# Patient Record
Sex: Female | Born: 1940 | Race: White | Hispanic: No | Marital: Single | Smoking: Former smoker
Health system: Southern US, Community
[De-identification: ages and names within clinical notes are randomized; demographics above are authoritative.]

## PROBLEM LIST (undated history)

## (undated) DIAGNOSIS — E7800 Pure hypercholesterolemia, unspecified: Secondary | ICD-10-CM

## (undated) DIAGNOSIS — E119 Type 2 diabetes mellitus without complications: Secondary | ICD-10-CM

## (undated) DIAGNOSIS — I10 Essential (primary) hypertension: Secondary | ICD-10-CM

## (undated) HISTORY — PX: FRACTURE SURGERY: SHX138

---
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
## 1976-04-10 HISTORY — PX: TUBAL LIGATION: SHX77

## 1976-04-10 HISTORY — PX: DILATION AND CURETTAGE OF UTERUS: SHX78

## 2006-04-10 HISTORY — PX: CATARACT EXTRACTION W/ INTRAOCULAR LENS IMPLANT: SHX1309

## 2010-04-10 HISTORY — PX: ANKLE FRACTURE SURGERY: SHX122

## 2011-01-22 ENCOUNTER — Emergency Department: Payer: Self-pay | Admitting: Internal Medicine

## 2014-11-06 ENCOUNTER — Encounter: Payer: Self-pay | Admitting: Emergency Medicine

## 2014-11-06 ENCOUNTER — Ambulatory Visit: Payer: Medicare Other

## 2014-11-06 ENCOUNTER — Ambulatory Visit
Admission: EM | Admit: 2014-11-06 | Discharge: 2014-11-06 | Disposition: A | Discharge status: Home / Self Care | Payer: Medicare Other | Attending: Emergency Medicine | Admitting: Emergency Medicine

## 2014-11-06 DIAGNOSIS — I10 Essential (primary) hypertension: Secondary | ICD-10-CM | POA: Diagnosis not present

## 2014-11-06 DIAGNOSIS — S42292A Other displaced fracture of upper end of left humerus, initial encounter for closed fracture: Secondary | ICD-10-CM | POA: Insufficient documentation

## 2014-11-06 DIAGNOSIS — Z7982 Long term (current) use of aspirin: Secondary | ICD-10-CM | POA: Insufficient documentation

## 2014-11-06 DIAGNOSIS — E119 Type 2 diabetes mellitus without complications: Secondary | ICD-10-CM | POA: Diagnosis not present

## 2014-11-06 DIAGNOSIS — Z79899 Other long term (current) drug therapy: Secondary | ICD-10-CM | POA: Insufficient documentation

## 2014-11-06 DIAGNOSIS — W19XXXA Unspecified fall, initial encounter: Secondary | ICD-10-CM | POA: Insufficient documentation

## 2014-11-06 DIAGNOSIS — M25512 Pain in left shoulder: Secondary | ICD-10-CM | POA: Diagnosis present

## 2014-11-06 HISTORY — DX: Essential (primary) hypertension: I10

## 2014-11-06 HISTORY — DX: Type 2 diabetes mellitus without complications: E11.9

## 2014-11-06 MED ORDER — HYDROCODONE-ACETAMINOPHEN 5-325 MG PO TABS
2.0000 | ORAL_TABLET | Freq: Once | ORAL | Status: AC
  Administered 2014-11-06: 2 via ORAL

## 2014-11-06 MED ORDER — HYDROCODONE-ACETAMINOPHEN 5-325 MG PO TABS: 1.0000 | ORAL_TABLET | ORAL | Status: DC | PRN

## 2014-11-06 MED ORDER — KETOROLAC TROMETHAMINE 60 MG/2ML IM SOLN
30.0000 mg | Freq: Once | INTRAMUSCULAR | Status: AC
  Administered 2014-11-06: 30 mg via INTRAMUSCULAR

## 2014-11-06 NOTE — Discharge Instructions (Signed)
Follow up with Dr. Ernest Pine on Monday. Go to the ER for the signs and symptoms we discussed. Be careful with the Vicodin, it increases your risk for fall. Make sure you have somebody staying with you while you're taking this medication.

## 2014-11-06 NOTE — ED Provider Notes (Addendum)
HPI  SUBJECTIVE:  Alicia [name] is a 74 y.o. female who presents with constant left shoulder pain after she had a slip and fall, landing on it. She states that she was going up some stairs, missed the stairs, fell and landed on her shoulder. Reports pain that becomes sharp with movement, worse with palpation. Has limitation of motion due to pain- states is unable to move arm at all. No alleviating factors. She has not tried any thing for this. She came immediately here. She denies chest pain, palpitations, presyncope, syncope causing the fall. She denies loss of consciousness, head injury, chest, neck, back, or other injury. Patient denies numbness, tingling in her hand. She report denies color change. She is not any anticoagulants or antiplatelets. Past medical history of hypertension, diabetes, osteopenia, no history of stroke, MI, arrhythmia, previous history of injury to the shoulder.   Past Medical History  Diagnosis Date  . Diabetes mellitus without complication   . Hypertension     History reviewed. No pertinent past surgical history.  History reviewed. No pertinent family history.  History  Substance Use Topics  . Smoking status: Never Smoker   . Smokeless tobacco: Not on file  . Alcohol Use: No    No current facility-administered medications for this encounter.  Current outpatient prescriptions:  .  amLODipine (NORVASC) 10 MG tablet, Take 10 mg by mouth daily., Disp: , Rfl:  .  aspirin 81 MG tablet, Take 81 mg by mouth daily., Disp: , Rfl:  .  atorvastatin (LIPITOR) 20 MG tablet, Take 20 mg by mouth daily., Disp: , Rfl:  .  calcium carbonate (TUMS - DOSED IN MG ELEMENTAL CALCIUM) 500 MG chewable tablet, Chew 3 tablets by mouth 2 (two) times daily with a meal., Disp: , Rfl:  .  furosemide (LASIX) 20 MG tablet, Take 20 mg by mouth 3 (three) times a week., Disp: , Rfl:  .  hydrochlorothiazide (HYDRODIURIL) 25 MG tablet, Take 25 mg by mouth daily., Disp: , Rfl:  .  losartan  (COZAAR) 50 MG tablet, Take 50 mg by mouth daily., Disp: , Rfl:  .  metFORMIN (GLUCOPHAGE) 500 MG tablet, Take by mouth 2 (two) times daily with a meal., Disp: , Rfl:  .  metoprolol succinate (TOPROL-XL) 100 MG 24 hr tablet, Take 100 mg by mouth daily. Take with or immediately following a meal., Disp: , Rfl:  .  Omega-3 Fatty Acids ([employer] FISH OIL) 1000 MG CAPS, Take 2,000 mg by mouth daily., Disp: , Rfl:  .  omeprazole (PRILOSEC) 20 MG capsule, Take 20 mg by mouth daily., Disp: , Rfl:  .  HYDROcodone-acetaminophen (NORCO/VICODIN) 5-325 MG per tablet, Take 1-2 tablets by mouth every 4 (four) hours as needed for moderate pain or severe pain., Disp: 30 tablet, Rfl: 0  No Known Allergies   ROS  As noted in HPI.   Physical Exam  BP 121/54 mmHg  Pulse 66  Temp(Src) 97.5 F (36.4 C) (Oral)  Resp 16  SpO2 95%  Constitutional: Well developed, well nourished, and in mild/moderate painful distress Eyes:  EOMI, conjunctiva normal bilaterally HENT: Normocephalic, atraumatic,mucus membranes moist Respiratory: Normal inspiratory effort Cardiovascular: Normal rate GI: nondistended skin: No rash, skin intact Musculoskeletal: L shoulder with ROM severely limited, Physical exam limited due to patient discomfort. Unable to perform drop test. clavicle NT , A/C joint tender, scapula tender, proximal humerus tender, shoulder joint tender, Motor strength decreased at shoulder due to pain, Sensation intact LT over deltoid region, distal NVI with hand  on affected side having intact sensation and strength in the distribution of the median, radial, and ulnar nerve function. RP 2+.  unable to perform empty can test, lift off test, abduction/external rotation. Neurologic: Alert & oriented x 3, no focal neuro deficits Psychiatric: Speech and behavior appropriate   ED Course   Medications  ketorolac (TORADOL) injection 30 mg (30 mg Intramuscular Given 11/06/14 1720)  HYDROcodone-acetaminophen (NORCO/VICODIN)  5-325 MG per tablet 2 tablet (2 tablets Oral Given 11/06/14 1805)    Orders Placed This Encounter  Procedures  . DG Shoulder Left    Standing Status: Standing     Number of Occurrences: 1     Standing Expiration Date:     Order Specific Question:  Reason for Exam (SYMPTOM  OR DIAGNOSIS REQUIRED)    Answer:  fall, L shoulder pain r/o fx, dislocation  . Apply ice to affected area    Standing Status: Standing     Number of Occurrences: 1     Standing Expiration Date:     No results found for this or any previous visit (from the past 24 hour(s)). Dg Shoulder Left  11/06/2014   CLINICAL DATA:  Fall with left shoulder injury and pain today. Initial encounter.  EXAM: LEFT SHOULDER - 2+ VIEW  COMPARISON:  None.  FINDINGS: A comminuted fracture of the left humeral head an neck identified. Subluxation of the humeral head is noted.  Apex anterior angulation is noted.  Displacement of some fracture fragments identified.  IMPRESSION: Comminuted fracture of the humeral head/neck with some displacement and mild angulation.   Electronically Signed   By: Harmon Pier M.D.   On: 11/06/2014 17:42   Dg Shoulder Left  11/06/2014   CLINICAL DATA:  Fall with left shoulder injury and pain today. Initial encounter.  EXAM: LEFT SHOULDER - 2+ VIEW  COMPARISON:  None.  FINDINGS: A comminuted fracture of the left humeral head an neck identified. Subluxation of the humeral head is noted.  Apex anterior angulation is noted.  Displacement of some fracture fragments identified.  IMPRESSION: Comminuted fracture of the humeral head/neck with some displacement and mild angulation.   Electronically Signed   By: Harmon Pier M.D.   On: 11/06/2014 17:42   ED Clinical Impression  Humeral head fracture, left, closed, initial encounter   Assessment/Plan   Initial exam limited due to patient pain. We'll image, given Toradol x 1.   Reviewed imaging independently. Comminuted fracture of the humeral head with some displacement and  angulation. See radiology report for full details.   Discussed with Dr. Ernest Pine, orthopedic surgeon on call. Patient to be placed in a sling/swath, home with Vicodin, follow-up with them on Monday or Tuesday for assessment for definitive treatment  Discussed imaging, MDM, plan and followup with patient /family. Discussed sn/sx that should prompt return to the UC or ED. Patient agrees with plan.   *This clinic note was created using Dragon dictation software. Therefore, there may be occasional mistakes despite careful proofreading.  ?    Domenick Gong, MD 11/06/14 1308  Domenick Gong, MD 11/06/14 [phone]

## 2014-11-06 NOTE — ED Notes (Signed)
Pt states she fell at a house she was looking at today down the back stairs

## 2014-11-11 ENCOUNTER — Other Ambulatory Visit: Payer: Self-pay | Admitting: Student

## 2014-11-11 ENCOUNTER — Ambulatory Visit
Admission: RE | Admit: 2014-11-11 | Discharge: 2014-11-11 | Disposition: A | Discharge status: Home / Self Care | Payer: Medicare Other | Source: Ambulatory Visit | Attending: Student | Admitting: Student

## 2014-11-11 DIAGNOSIS — S42202A Unspecified fracture of upper end of left humerus, initial encounter for closed fracture: Secondary | ICD-10-CM | POA: Diagnosis not present

## 2014-11-11 DIAGNOSIS — S42222A 2-part displaced fracture of surgical neck of left humerus, initial encounter for closed fracture: Secondary | ICD-10-CM

## 2014-11-16 ENCOUNTER — Encounter
Admission: RE | Admit: 2014-11-16 | Discharge: 2014-11-16 | Disposition: A | Discharge status: Home / Self Care | Payer: Medicare Other | Source: Ambulatory Visit | Attending: Surgery | Admitting: Surgery

## 2014-11-16 DIAGNOSIS — I10 Essential (primary) hypertension: Secondary | ICD-10-CM | POA: Diagnosis not present

## 2014-11-16 HISTORY — DX: Pure hypercholesterolemia, unspecified: E78.00

## 2014-11-16 LAB — URINALYSIS COMPLETE WITH MICROSCOPIC (ARMC ONLY)
BILIRUBIN URINE: NEGATIVE
Glucose, UA: NEGATIVE mg/dL
HGB URINE DIPSTICK: NEGATIVE
Ketones, ur: NEGATIVE mg/dL
Leukocytes, UA: NEGATIVE
NITRITE: NEGATIVE
PH: 7 (ref 5.0–8.0)
Protein, ur: NEGATIVE mg/dL
Specific Gravity, Urine: 1.011 (ref 1.005–1.030)

## 2014-11-16 LAB — TYPE AND SCREEN
ABO/RH(D): A POS
ANTIBODY SCREEN: NEGATIVE

## 2014-11-16 LAB — CBC
HEMATOCRIT: 33.8 % — AB (ref 35.0–47.0)
Hemoglobin: 11 g/dL — ABNORMAL LOW (ref 12.0–16.0)
MCH: 27 pg (ref 26.0–34.0)
MCHC: 32.6 g/dL (ref 32.0–36.0)
MCV: 82.7 fL (ref 80.0–100.0)
Platelets: 330 10*3/uL (ref 150–440)
RBC: 4.09 MIL/uL (ref 3.80–5.20)
RDW: 15.6 % — AB (ref 11.5–14.5)
WBC: 9.2 10*3/uL (ref 3.6–11.0)

## 2014-11-16 LAB — SURGICAL PCR SCREEN
MRSA, PCR: NEGATIVE
Staphylococcus aureus: NEGATIVE

## 2014-11-16 LAB — APTT: APTT: 28 s (ref 24–36)

## 2014-11-16 LAB — PROTIME-INR
INR: 0.99
Prothrombin Time: 13.3 seconds (ref 11.4–15.0)

## 2014-11-16 LAB — ABO/RH: ABO/RH(D): A POS

## 2014-11-16 MED ORDER — TRANEXAMIC ACID 1000 MG/10ML IV SOLN
1000.0000 mg | Freq: Once | INTRAVENOUS | Status: DC
  Filled 2014-11-16: qty 10

## 2014-11-16 NOTE — Patient Instructions (Signed)
  Your procedure is scheduled on: Tuesday November 17, 2014 Report to Same Day Surgery. To find out your arrival time please call [phone] today between 1PM - 3PM.  Remember: Instructions that are not followed completely may result in serious medical risk, up to and including death, or upon the discretion of your surgeon and anesthesiologist your surgery may need to be rescheduled.    __x__ 1. Do not eat food or drink liquids after midnight. No gum chewing or hard candies.     ____ 2. No Alcohol for 24 hours before or after surgery.   ____ 3. Bring all medications with you on the day of surgery if instructed.    __x__ 4. Notify your doctor if there is any change in your medical condition     (cold, fever, infections).     Do not wear jewelry, make-up, hairpins, clips or nail polish.  Do not wear lotions, powders, or perfumes. You may wear deodorant.  Do not shave 48 hours prior to surgery. Men may shave face and neck.  Do not bring valuables to the hospital.    [facility] is not responsible for any belongings or valuables.               Contacts, dentures or bridgework may not be worn into surgery.  Leave your suitcase in the car. After surgery it may be brought to your room.  For patients admitted to the hospital, discharge time is determined by your treatment team.   Patients discharged the day of surgery will not be allowed to drive home.    Please read over the following fact sheets that you were given:   [facility] Preparing for Surgery  __x__ Take these medicines the morning of surgery with A SIP OF WATER:    1. amLODipine (NORVASC)  2. losartan (COZAAR)  3. metoprolol succinate (TOPROL-XL)   4.omeprazole (PRILOSEC)   ____ Fleet Enema (as directed)   __x__ Use CHG Soap as directed  ____ Use inhalers on the day of surgery  __x_ Stop metformin now.    ____ Take 1/2 of usual insulin dose the night before surgery and none on the morning of surgery.   __x_  Stopped aspirin on 11/12/14.  __x__ Stop all Anti-inflammatories(ibuprofen) on now.   __x__ Stop supplements, fish oil until after surgery.    ____ Bring C-Pap to the hospital.

## 2014-11-17 ENCOUNTER — Encounter: Payer: Self-pay | Admitting: *Deleted

## 2014-11-17 ENCOUNTER — Inpatient Hospital Stay: Payer: Medicare Other

## 2014-11-17 ENCOUNTER — Inpatient Hospital Stay: Payer: Medicare Other | Admitting: Certified Registered Nurse Anesthetist

## 2014-11-17 ENCOUNTER — Encounter
Admission: RE | Disposition: A | Discharge status: Skilled Nursing Facility | Payer: Self-pay | Source: Ambulatory Visit | Attending: Surgery

## 2014-11-17 ENCOUNTER — Inpatient Hospital Stay
Admission: RE | Admit: 2014-11-17 | Discharge: 2014-11-20 | DRG: 483 | Disposition: A | Discharge status: Skilled Nursing Facility | Payer: Medicare Other | Source: Ambulatory Visit | Attending: Surgery | Admitting: Surgery

## 2014-11-17 DIAGNOSIS — Y92009 Unspecified place in unspecified non-institutional (private) residence as the place of occurrence of the external cause: Secondary | ICD-10-CM

## 2014-11-17 DIAGNOSIS — I10 Essential (primary) hypertension: Secondary | ICD-10-CM | POA: Diagnosis present

## 2014-11-17 DIAGNOSIS — Z87891 Personal history of nicotine dependence: Secondary | ICD-10-CM | POA: Diagnosis not present

## 2014-11-17 DIAGNOSIS — W108XXA Fall (on) (from) other stairs and steps, initial encounter: Secondary | ICD-10-CM | POA: Diagnosis present

## 2014-11-17 DIAGNOSIS — Z6841 Body Mass Index (BMI) 40.0 and over, adult: Secondary | ICD-10-CM | POA: Diagnosis not present

## 2014-11-17 DIAGNOSIS — E119 Type 2 diabetes mellitus without complications: Secondary | ICD-10-CM | POA: Diagnosis present

## 2014-11-17 DIAGNOSIS — E780 Pure hypercholesterolemia: Secondary | ICD-10-CM | POA: Diagnosis present

## 2014-11-17 DIAGNOSIS — Z96619 Presence of unspecified artificial shoulder joint: Secondary | ICD-10-CM

## 2014-11-17 DIAGNOSIS — S42242A 4-part fracture of surgical neck of left humerus, initial encounter for closed fracture: Secondary | ICD-10-CM | POA: Diagnosis present

## 2014-11-17 DIAGNOSIS — Z96612 Presence of left artificial shoulder joint: Secondary | ICD-10-CM

## 2014-11-17 DIAGNOSIS — Z9181 History of falling: Secondary | ICD-10-CM | POA: Diagnosis not present

## 2014-11-17 HISTORY — PX: SHOULDER HEMI-ARTHROPLASTY: SHX5049

## 2014-11-17 LAB — GLUCOSE, CAPILLARY
GLUCOSE-CAPILLARY: 128 mg/dL — AB (ref 65–99)
GLUCOSE-CAPILLARY: 165 mg/dL — AB (ref 65–99)

## 2014-11-17 SURGERY — HEMIARTHROPLASTY, SHOULDER
Anesthesia: Regional | Laterality: Left

## 2014-11-17 MED ORDER — ROCURONIUM BROMIDE 100 MG/10ML IV SOLN
INTRAVENOUS | Status: DC | PRN
  Administered 2014-11-17 (×4): 10 mg via INTRAVENOUS
  Administered 2014-11-17: 40 mg via INTRAVENOUS

## 2014-11-17 MED ORDER — TRANEXAMIC ACID 1000 MG/10ML IV SOLN: 1000.0000 mg | Freq: Once | INTRAVENOUS | Status: DC

## 2014-11-17 MED ORDER — OXYCODONE HCL 5 MG PO TABS
5.0000 mg | ORAL_TABLET | ORAL | Status: DC | PRN
  Administered 2014-11-17: 5 mg via ORAL
  Administered 2014-11-18: 10 mg via ORAL
  Administered 2014-11-18: 5 mg via ORAL
  Administered 2014-11-18 – 2014-11-19 (×5): 10 mg via ORAL
  Administered 2014-11-19: 5 mg via ORAL
  Administered 2014-11-20 (×3): 10 mg via ORAL
  Filled 2014-11-17 (×5): qty 2
  Filled 2014-11-17: qty 1
  Filled 2014-11-17 (×2): qty 2
  Filled 2014-11-17 (×2): qty 1
  Filled 2014-11-17 (×2): qty 2
  Filled 2014-11-17: qty 1

## 2014-11-17 MED ORDER — LACTATED RINGERS IV SOLN
INTRAVENOUS | Status: DC | PRN
  Administered 2014-11-17: 18:00:00 via INTRAVENOUS

## 2014-11-17 MED ORDER — ROPIVACAINE HCL 5 MG/ML IJ SOLN
INTRAMUSCULAR | Status: AC
  Filled 2014-11-17: qty 40

## 2014-11-17 MED ORDER — KCL IN DEXTROSE-NACL 20-5-0.9 MEQ/L-%-% IV SOLN
INTRAVENOUS | Status: DC
  Administered 2014-11-17 – 2014-11-18 (×2): via INTRAVENOUS
  Filled 2014-11-17 (×7): qty 1000

## 2014-11-17 MED ORDER — ONDANSETRON HCL 4 MG/2ML IJ SOLN: 4.0000 mg | Freq: Four times a day (QID) | INTRAMUSCULAR | Status: DC | PRN

## 2014-11-17 MED ORDER — HYDROMORPHONE HCL 1 MG/ML IJ SOLN
0.5000 mg | INTRAMUSCULAR | Status: DC | PRN
  Administered 2014-11-18: 0.5 mg via INTRAVENOUS
  Filled 2014-11-17: qty 1

## 2014-11-17 MED ORDER — METOPROLOL SUCCINATE ER 100 MG PO TB24
100.0000 mg | ORAL_TABLET | ORAL | Status: DC
  Administered 2014-11-18 – 2014-11-20 (×3): 100 mg via ORAL
  Filled 2014-11-17 (×3): qty 1

## 2014-11-17 MED ORDER — PHENYLEPHRINE HCL 10 MG/ML IJ SOLN
INTRAMUSCULAR | Status: DC | PRN
  Administered 2014-11-17 (×10): 100 ug via INTRAVENOUS

## 2014-11-17 MED ORDER — MAGNESIUM HYDROXIDE 400 MG/5ML PO SUSP: 30.0000 mL | Freq: Every day | ORAL | Status: DC | PRN

## 2014-11-17 MED ORDER — HYDROCHLOROTHIAZIDE 25 MG PO TABS
25.0000 mg | ORAL_TABLET | ORAL | Status: DC
  Administered 2014-11-18 – 2014-11-20 (×3): 25 mg via ORAL
  Filled 2014-11-17 (×3): qty 1

## 2014-11-17 MED ORDER — CEFAZOLIN SODIUM-DEXTROSE 2-3 GM-% IV SOLR
2.0000 g | Freq: Once | INTRAVENOUS | Status: AC
  Administered 2014-11-17: 2 g via INTRAVENOUS

## 2014-11-17 MED ORDER — ENOXAPARIN SODIUM 40 MG/0.4ML ~~LOC~~ SOLN
40.0000 mg | Freq: Two times a day (BID) | SUBCUTANEOUS | Status: DC
  Administered 2014-11-18 – 2014-11-20 (×5): 40 mg via SUBCUTANEOUS
  Filled 2014-11-17 (×5): qty 0.4

## 2014-11-17 MED ORDER — NEOMYCIN-POLYMYXIN B GU 40-200000 IR SOLN
Status: DC | PRN
  Administered 2014-11-17: 16 mL

## 2014-11-17 MED ORDER — TRANEXAMIC ACID 1000 MG/10ML IV SOLN
1000.0000 mg | INTRAVENOUS | Status: DC | PRN
  Administered 2014-11-17: 1000 mg via TOPICAL

## 2014-11-17 MED ORDER — ONDANSETRON HCL 4 MG PO TABS: 4.0000 mg | ORAL_TABLET | Freq: Four times a day (QID) | ORAL | Status: DC | PRN

## 2014-11-17 MED ORDER — LOSARTAN POTASSIUM 50 MG PO TABS
75.0000 mg | ORAL_TABLET | ORAL | Status: DC
  Administered 2014-11-18 – 2014-11-20 (×3): 75 mg via ORAL
  Filled 2014-11-17 (×3): qty 1

## 2014-11-17 MED ORDER — SODIUM CHLORIDE 0.9 % IV SOLN
INTRAVENOUS | Status: DC | PRN
  Administered 2014-11-17: 60 mL

## 2014-11-17 MED ORDER — ACETAMINOPHEN 650 MG RE SUPP: 650.0000 mg | Freq: Four times a day (QID) | RECTAL | Status: DC | PRN

## 2014-11-17 MED ORDER — DIPHENHYDRAMINE HCL 12.5 MG/5ML PO ELIX: 12.5000 mg | ORAL_SOLUTION | ORAL | Status: DC | PRN

## 2014-11-17 MED ORDER — OMEGA-3-ACID ETHYL ESTERS 1 G PO CAPS
1.0000 | ORAL_CAPSULE | Freq: Two times a day (BID) | ORAL | Status: DC
  Administered 2014-11-17 – 2014-11-20 (×6): 1 g via ORAL
  Filled 2014-11-17 (×7): qty 1

## 2014-11-17 MED ORDER — LIDOCAINE HCL (CARDIAC) 20 MG/ML IV SOLN
INTRAVENOUS | Status: DC | PRN
  Administered 2014-11-17: 60 mg via INTRAVENOUS

## 2014-11-17 MED ORDER — TRANEXAMIC ACID 1000 MG/10ML IV SOLN
INTRAVENOUS | Status: AC
  Filled 2014-11-17: qty 10

## 2014-11-17 MED ORDER — SODIUM CHLORIDE 0.9 % IJ SOLN
INTRAMUSCULAR | Status: AC
  Filled 2014-11-17: qty 50

## 2014-11-17 MED ORDER — CALCIUM CARBONATE-VITAMIN D 500-200 MG-UNIT PO TABS
1.0000 | ORAL_TABLET | Freq: Two times a day (BID) | ORAL | Status: DC
  Administered 2014-11-17 – 2014-11-20 (×5): 1 via ORAL
  Filled 2014-11-17 (×5): qty 1

## 2014-11-17 MED ORDER — METOCLOPRAMIDE HCL 5 MG/ML IJ SOLN: 5.0000 mg | Freq: Three times a day (TID) | INTRAMUSCULAR | Status: DC | PRN

## 2014-11-17 MED ORDER — NEOMYCIN-POLYMYXIN B GU 40-200000 IR SOLN
Status: AC
  Filled 2014-11-17: qty 20

## 2014-11-17 MED ORDER — BUPIVACAINE LIPOSOME 1.3 % IJ SUSP
INTRAMUSCULAR | Status: AC
  Filled 2014-11-17: qty 20

## 2014-11-17 MED ORDER — FLEET ENEMA 7-19 GM/118ML RE ENEM: 1.0000 | ENEMA | Freq: Once | RECTAL | Status: DC | PRN

## 2014-11-17 MED ORDER — FENTANYL CITRATE (PF) 100 MCG/2ML IJ SOLN
INTRAMUSCULAR | Status: AC
  Filled 2014-11-17: qty 2

## 2014-11-17 MED ORDER — LIDOCAINE HCL (PF) 1 % IJ SOLN
INTRAMUSCULAR | Status: AC
  Filled 2014-11-17: qty 5

## 2014-11-17 MED ORDER — ASPIRIN EC 81 MG PO TBEC
81.0000 mg | DELAYED_RELEASE_TABLET | Freq: Every evening | ORAL | Status: DC
Start: 2014-11-17 — End: 2014-11-20
  Administered 2014-11-17 – 2014-11-19 (×3): 81 mg via ORAL
  Filled 2014-11-17 (×3): qty 1

## 2014-11-17 MED ORDER — FUROSEMIDE 20 MG PO TABS
20.0000 mg | ORAL_TABLET | ORAL | Status: DC
  Administered 2014-11-18: 20 mg via ORAL
  Filled 2014-11-17 (×2): qty 1

## 2014-11-17 MED ORDER — ATORVASTATIN CALCIUM 20 MG PO TABS
20.0000 mg | ORAL_TABLET | Freq: Every day | ORAL | Status: DC
  Administered 2014-11-18 – 2014-11-19 (×2): 20 mg via ORAL
  Filled 2014-11-17 (×2): qty 1

## 2014-11-17 MED ORDER — FENTANYL CITRATE (PF) 100 MCG/2ML IJ SOLN
25.0000 ug | INTRAMUSCULAR | Status: DC | PRN
  Administered 2014-11-17 (×4): 25 ug via INTRAVENOUS

## 2014-11-17 MED ORDER — PHENYLEPHRINE HCL 10 MG/ML IJ SOLN
INTRAMUSCULAR | Status: AC
  Filled 2014-11-17: qty 1

## 2014-11-17 MED ORDER — SUGAMMADEX SODIUM 200 MG/2ML IV SOLN
INTRAVENOUS | Status: DC | PRN
  Administered 2014-11-17: 226.8 mg via INTRAVENOUS

## 2014-11-17 MED ORDER — METFORMIN HCL 500 MG PO TABS
500.0000 mg | ORAL_TABLET | Freq: Two times a day (BID) | ORAL | Status: DC
  Administered 2014-11-18 – 2014-11-20 (×5): 500 mg via ORAL
  Filled 2014-11-17 (×5): qty 1

## 2014-11-17 MED ORDER — ACETAMINOPHEN 325 MG PO TABS: 650.0000 mg | ORAL_TABLET | Freq: Four times a day (QID) | ORAL | Status: DC | PRN

## 2014-11-17 MED ORDER — BUPIVACAINE-EPINEPHRINE (PF) 0.5% -1:200000 IJ SOLN
INTRAMUSCULAR | Status: AC
  Filled 2014-11-17: qty 30

## 2014-11-17 MED ORDER — DOCUSATE SODIUM 100 MG PO CAPS
100.0000 mg | ORAL_CAPSULE | Freq: Two times a day (BID) | ORAL | Status: DC
  Administered 2014-11-17 – 2014-11-20 (×6): 100 mg via ORAL
  Filled 2014-11-17 (×7): qty 1

## 2014-11-17 MED ORDER — FENTANYL CITRATE (PF) 100 MCG/2ML IJ SOLN
INTRAMUSCULAR | Status: AC
  Administered 2014-11-17: 25 ug via INTRAVENOUS
  Filled 2014-11-17: qty 2

## 2014-11-17 MED ORDER — BISACODYL 10 MG RE SUPP: 10.0000 mg | Freq: Every day | RECTAL | Status: DC | PRN

## 2014-11-17 MED ORDER — PANTOPRAZOLE SODIUM 40 MG PO TBEC
40.0000 mg | DELAYED_RELEASE_TABLET | Freq: Two times a day (BID) | ORAL | Status: DC
  Administered 2014-11-17 – 2014-11-20 (×6): 40 mg via ORAL
  Filled 2014-11-17 (×7): qty 1

## 2014-11-17 MED ORDER — ONDANSETRON HCL 4 MG/2ML IJ SOLN
INTRAMUSCULAR | Status: DC | PRN
  Administered 2014-11-17: 4 mg via INTRAVENOUS

## 2014-11-17 MED ORDER — CEFAZOLIN SODIUM-DEXTROSE 2-3 GM-% IV SOLR
2.0000 g | Freq: Four times a day (QID) | INTRAVENOUS | Status: AC
  Administered 2014-11-17 – 2014-11-18 (×2): 2 g via INTRAVENOUS
  Filled 2014-11-17 (×3): qty 50

## 2014-11-17 MED ORDER — METOCLOPRAMIDE HCL 5 MG PO TABS: 5.0000 mg | ORAL_TABLET | Freq: Three times a day (TID) | ORAL | Status: DC | PRN

## 2014-11-17 MED ORDER — BUPIVACAINE HCL (PF) 0.5 % IJ SOLN
INTRAMUSCULAR | Status: DC | PRN
  Administered 2014-11-17: 30 mL via PERINEURAL

## 2014-11-17 MED ORDER — DEXAMETHASONE SODIUM PHOSPHATE 4 MG/ML IJ SOLN
INTRAMUSCULAR | Status: DC | PRN
  Administered 2014-11-17: 10 mg via INTRAVENOUS

## 2014-11-17 MED ORDER — CEFAZOLIN SODIUM-DEXTROSE 2-3 GM-% IV SOLR
INTRAVENOUS | Status: AC
  Filled 2014-11-17: qty 50

## 2014-11-17 MED ORDER — SODIUM CHLORIDE 0.9 % IV SOLN
INTRAVENOUS | Status: DC
  Administered 2014-11-17: 13:00:00 via INTRAVENOUS

## 2014-11-17 MED ORDER — PROPOFOL 10 MG/ML IV BOLUS
INTRAVENOUS | Status: DC | PRN
  Administered 2014-11-17: 150 mg via INTRAVENOUS
  Administered 2014-11-17: 50 mg via INTRAVENOUS

## 2014-11-17 MED ORDER — AMLODIPINE BESYLATE 10 MG PO TABS
10.0000 mg | ORAL_TABLET | ORAL | Status: DC
  Administered 2014-11-18 – 2014-11-20 (×3): 10 mg via ORAL
  Filled 2014-11-17 (×3): qty 1

## 2014-11-17 SURGICAL SUPPLY — 69 items
BAG DECANTER STRL (MISCELLANEOUS) IMPLANT
BIT DRILL TWIST 2.7 (BIT) ×2 IMPLANT
BIT DRILL TWIST 2.7MM (BIT) ×1
BLADE SAGITTAL WIDE XTHICK NO (BLADE) ×3 IMPLANT
BONE CEMENT PALACOSE (Orthopedic Implant) ×3 IMPLANT
BOWL CEMENT MIX W/ADAPTER (MISCELLANEOUS) ×3 IMPLANT
CAPT SHLDR REVTOTAL 2 ×3 IMPLANT
CATH TRAY METER 16FR LF (MISCELLANEOUS) ×3 IMPLANT
CEMENT BONE PALACOSE (Orthopedic Implant) ×1 IMPLANT
CEMENT RESTRICTOR DEPUY SZ 1 (Cement) IMPLANT
CHLORAPREP W/TINT 26ML (MISCELLANEOUS) ×6 IMPLANT
COOLER POLAR GLACIER W/PUMP (MISCELLANEOUS) ×3 IMPLANT
DRAPE FLUOR MINI C-ARM 54X84 (DRAPES) IMPLANT
DRAPE IMP U-DRAPE 54X76 (DRAPES) ×6 IMPLANT
DRAPE INCISE IOBAN 66X45 STRL (DRAPES) ×3 IMPLANT
DRAPE INCISE IOBAN 66X60 STRL (DRAPES) ×3 IMPLANT
DRAPE SHEET LG 3/4 BI-LAMINATE (DRAPES) ×9 IMPLANT
DRAPE TABLE BACK 80X90 (DRAPES) ×3 IMPLANT
DRSG OPSITE POSTOP 4X8 (GAUZE/BANDAGES/DRESSINGS) ×3 IMPLANT
ELECT CAUTERY BLADE 6.4 (BLADE) ×3 IMPLANT
GAUZE PACK 2X3YD (MISCELLANEOUS) ×3 IMPLANT
GLOVE BIO SURGEON STRL SZ8 (GLOVE) ×18 IMPLANT
GLOVE INDICATOR 8.0 STRL GRN (GLOVE) ×18 IMPLANT
GOWN L4 XLG 20 PK N/S (GOWN DISPOSABLE) ×3 IMPLANT
GOWN STRL REUS W/ TWL LRG LVL3 (GOWN DISPOSABLE) ×2 IMPLANT
GOWN STRL REUS W/TWL LRG LVL3 (GOWN DISPOSABLE) ×4
HANDPIECE SUCTION TUBG SURGILV (MISCELLANEOUS) ×3 IMPLANT
HOOD PEEL AWAY FACE SHEILD DIS (HOOD) ×9 IMPLANT
KIT STABILIZATION SHOULDER (MISCELLANEOUS) ×3 IMPLANT
MASK FACE SPIDER DISP (MASK) ×3 IMPLANT
NDL MAYO CATGUT SZ1 (NEEDLE) ×6
NDL MAYO CATGUT SZ5 (NEEDLE) ×2
NDL SAFETY 22GX1.5 (NEEDLE) ×3 IMPLANT
NDL SAFETY 25GX1.5 (NEEDLE) ×3 IMPLANT
NDL SUT 5 .5 CRC TPR PNT MAYO (NEEDLE) ×1 IMPLANT
NEEDLE 18GX1X1/2 (RX/OR ONLY) (NEEDLE) ×3 IMPLANT
NEEDLE MAYO 6 CRC TAPER PT (NEEDLE) ×3 IMPLANT
NEEDLE MAYO CATGUT SZ1 (NEEDLE) ×2 IMPLANT
NEEDLE MAYO CATGUT SZ4 (NEEDLE) ×3 IMPLANT
NEEDLE SPNL 20GX3.5 QUINCKE YW (NEEDLE) ×3 IMPLANT
NS IRRIG 1000ML POUR BTL (IV SOLUTION) ×3 IMPLANT
PACK ARTHROSCOPY SHOULDER (MISCELLANEOUS) ×3 IMPLANT
PACK HOT/COLD REUSE GEL (MISCELLANEOUS) ×3 IMPLANT
PAD WRAPON POLAR SHDR XLG (MISCELLANEOUS) ×1 IMPLANT
PIN THREADED REVERSE (PIN) ×3 IMPLANT
RESTRICTOR CEMENT PE SZ 2 (Cement) ×3 IMPLANT
SLING ULTRA II M (MISCELLANEOUS) ×3 IMPLANT
SOL .9 NS 3000ML IRR  AL (IV SOLUTION) ×2
SOL .9 NS 3000ML IRR UROMATIC (IV SOLUTION) ×1 IMPLANT
SPONGE LAP 18X18 5 PK (GAUZE/BANDAGES/DRESSINGS) ×3 IMPLANT
STAPLER SKIN PROX 35W (STAPLE) ×3 IMPLANT
STRAP SAFETY BODY (MISCELLANEOUS) ×6 IMPLANT
SUT ETHIBOND #5 BRAIDED 30INL (SUTURE) ×9 IMPLANT
SUT ETHIBOND 0 MO6 C/R (SUTURE) ×3 IMPLANT
SUT ETHIBOND NAB CT1 #1 30IN (SUTURE) ×3 IMPLANT
SUT FIBERWIRE #2 38 BLUE 1/2 (SUTURE) ×15
SUT STEEL 5 (SUTURE) IMPLANT
SUT TICRON 2-0 30IN 311381 (SUTURE) ×15 IMPLANT
SUT VIC AB 0 CT1 36 (SUTURE) ×6 IMPLANT
SUT VIC AB 2-0 CT1 27 (SUTURE) ×4
SUT VIC AB 2-0 CT1 TAPERPNT 27 (SUTURE) ×2 IMPLANT
SUTURE FIBERWR #2 38 BLUE 1/2 (SUTURE) ×5 IMPLANT
SYR 30ML LL (SYRINGE) ×6 IMPLANT
SYR TB 1ML LUER SLIP (SYRINGE) ×3 IMPLANT
SYRINGE 10CC LL (SYRINGE) ×3 IMPLANT
TUBE CONNECTING  6'X3/16 (MISCELLANEOUS) ×1
TUBE CONNECTING 6X3/16 (MISCELLANEOUS) ×2 IMPLANT
WATER STERILE IRR 1000ML POUR (IV SOLUTION) IMPLANT
WRAPON POLAR PAD SHDR XLG (MISCELLANEOUS) ×3

## 2014-11-17 NOTE — Transfer of Care (Signed)
Immediate Anesthesia Transfer of Care Note  Patient: Alicia [name]  Procedure(s) Performed: Procedure(s): SHOULDER HEMI-ARTHROPLASTY (Left)  Patient Location: PACU  Anesthesia Type:General and Regional  Level of Consciousness: awake and alert   Airway & Oxygen Therapy: Patient Spontanous Breathing and Patient connected to face mask oxygen  Post-op Assessment: Report given to RN  Post vital signs: Reviewed  Last Vitals:  Filed Vitals:   11/17/14 1849  BP: 150/58  Pulse: 89  Temp: 37.8 C  Resp: 23    Complications: No apparent anesthesia complications

## 2014-11-17 NOTE — Op Note (Signed)
11/17/2014  6:17 PM  Patient:   Alicia [name]  Pre-Op Diagnosis:   Displaced 4-part proximal humerus fracture, left shoulder.  Post-Op Diagnosis:   Same.  Procedure:   Reverse left total shoulder arthroplasty.  Surgeon:   Maryagnes Amos, MD  Assistant:   Horris Latino, PA-C  Anesthesia:   General endotracheal with an interscalene block placed preoperatively by the anesthesiologist.  Findings:   As above.  Complications:   None  EBL:  250 cc  Fluids:   1200 cc crystalloid  UOP:  400 cc  TT:   None  Drains:   None  Closure:   Staples  Implants:   Biomet Comprehensive system using a #8 cemented fracture humeral stem and a 10 mm centralizer with a #2 cement restrictor, a 44 mm humeral tray with a standard insert, and a mini-base plate with a 36 mm glenosphere.  Brief Clinical Note:   The patient is a 75 year old female who sustained the above-noted injury about 10 days ago when she fell onto her left side. She presented emergency room where x-rays confirmed the above-noted injury. She was placed into a shoulder immobilizer. A CT scan was obtained. She presents at this time for a reverse left total shoulder arthroplasty.  Procedure:   After adequate IV sedation was achieved, an interscalene block was placed preoperatively by the anesthesiologist in the PACU before the patient was brought into the operating room and lain in the supine position. The patient underwent general endotracheal intubation and anesthesia before being repositioned in the beach chair position using the beach chair positioner. The left shoulder and upper extremity were prepped with ChloraPrep solution before being draped sterilely. Preoperative antibiotics were administered. A standard anterior approach to the shoulder was made through an approximately 5-6 inch incision. The incision was carried down through the subcutaneous tissues to expose the deltopectoral fascia. The interval between the deltoid and  pectoralis muscles was identified and this plane developed, retracting the cephalic vein laterally with the deltoid muscle. The conjoined tendon was identified. Its lateral margin was dissected and the Kolbel self-retraining retractor inserted. The biceps tendon was identified in the bicipital groove and followed proximally into the rotator interval, splitting the supraspinatus and subscapularis tendons. The subscapularis tendon was identified and dissected free along with a small piece of lesser tuberosity. Superiorly and posteriorly, the greater tuberosity was in multiple pieces. Therefore, it was elected to proceed with a reverse total shoulder arthroplasty. The humeral head was dissected out and removed, along with several other pieces of bone, including the bicipital groove fragment, which no longer had any soft tissue attached.   Attention was directed to the glenoid. The labrum was debrided circumferentially before the center of the glenoid was marked with electrocautery. The guidewire was drilled into the glenoid neck using the appropriate guide. After verifying its position, it was overreamed with the mini-baseplate reamer to create a flat surface. The permanent mini-baseplate was impacted into place. It was stabilized with a 25 mm central screw and three peripheral screws. The anterior screw was not utilized, as the native glenoid was so small that this would not have obtained any bone purchase. Locking screws were placed superiorly and inferiorly while a nonlocking screw was placed posteriorly. The permanent 36 mm glenosphere was then impacted into place and its Morse taper locking mechanism verified using manual distraction.  Attention was directed to the humeral side. Meticulous dissection was carried out in order to visualize the proximal humerus. Abundant hematoma was removed from  the proximal canal. The humeral canal was reamed sequentially beginning with the end-cutting reamer, then progressing  from a 4 mm reamer up to a 10 mm reamer. This provided excellent circumferential chatter. The 10 mm canal sleeve was inserted the appropriate depth after tapping. The #2 cement spacer was inserted prior to placing the canal sleeve. The canal was prepared for cementing by irrigating and thoroughly with bacitracin saline solution via the jet lavage system and packing it with a Neo-Synephrine soaked vaginal sponge. Meanwhile, cement was mixed on the back table. When the cement was ready, the cement was injected into the humeral canal. The #8 humeral fracture stem was impacted into place to the appropriate depth while maintaining approximately 30 of retroversion. Excess cement was removed using Personal assistant. Once the cement had hardened, a trial reduction was performed using the standard 44 humeral tray with the +0 mm polyethylene insert. The arm demonstrated excellent range of motion as the hand could be brought across the chest to the opposite shoulder and brought to the top of the patient's head and to the patient's ear. The shoulder appeared stable throughout this range of motion. The joint was dislocated and the trial components removed. The permanent 44 mm humeral platform with the standard insert was put together on the back table and impacted into place. The Morse taper locking mechanism was verified using manual distraction. The shoulder was relocated using firm pressure and again placed through a range of motion with the findings as described above.  The wound was copiously irrigated with bacitracin saline solution using the jet lavage system before a total of 20 cc of Exparel diluted out to 60 cc with normal saline and 30 cc of 0.5% Sensorcaine with epinephrine was injected into the pericapsular and peri-incisional tissues to help with postoperative analgesia. The subscapularis tendon was reapproximated using #2 FiberWire interrupted sutures, several of which had been placed through drill holes in the  humeral shaft prior to cementing. Several others had been passed through the remnants of the supraspinatus tendon. The biceps tendon was incorporated into this repair in order to effect a tenodesis. The deltopectoral interval was closed using 2-0 Vicryl interrupted sutures before the subcutaneous tissues also were closed using 2-0 Vicryl interrupted sutures. The skin was closed using staples. Prior to closing the skin, 1 g of transexemic acid in 10 cc of normal saline was injected intra-articularly to help with postoperative bleeding. A sterile occlusive dressing was applied to the wound before the arm was placed into a shoulder immobilizer with an abduction pillow. A Polar Care system also was applied to the shoulder. The patient was then transferred back to a hospital bed before being awakened, extubated, and returned to the recovery room in satisfactory condition after tolerating the procedure well.

## 2014-11-17 NOTE — Anesthesia Procedure Notes (Signed)
Anesthesia Regional Block:  Interscalene brachial plexus block  Pre-Anesthetic Checklist: ,, timeout performed, Correct Patient, Correct Site, Correct Laterality, Correct Procedure, Correct Position, site marked, Risks and benefits discussed,  Surgical consent,  Pre-op evaluation,  At surgeon's request and post-op pain management  Laterality: Left  Prep: chloraprep       Needles:  Injection technique: Single-shot  Needle Type: Echogenic Stimulator Needle     Needle Length: 13cm 13 cm Needle Gauge: 21 and 21 G    Additional Needles:  Procedures: ultrasound guided (picture in chart) Interscalene brachial plexus block Narrative:  Start time: 11/17/2014 2:20 PM End time: 11/17/2014 2:25 PM Injection made incrementally with aspirations every 5 mL.  Performed by: Personally  Anesthesiologist: Lenard Simmer

## 2014-11-17 NOTE — Anesthesia Preprocedure Evaluation (Signed)
Anesthesia Evaluation  Patient identified by MRN, date of birth, ID band Patient awake    Reviewed: Allergy & Precautions, H&P , NPO status , Patient's Chart, lab work & pertinent test results, reviewed documented beta blocker date and time   History of Anesthesia Complications Negative for: history of anesthetic complications  Airway Mallampati: III  TM Distance: >3 FB Neck ROM: full    Dental no notable dental hx. (+) Partial Upper, Partial Lower, Poor Dentition   Pulmonary neg shortness of breath, neg COPDneg recent URI, Current Smoker,  breath sounds clear to auscultation  Pulmonary exam normal       Cardiovascular Exercise Tolerance: Good hypertension, On Medications - CAD, - Past MI, - Cardiac Stents and - CABG Normal cardiovascular exam- dysrhythmias - Valvular Problems/MurmursRhythm:regular Rate:Normal     Neuro/Psych negative neurological ROS  negative psych ROS   GI/Hepatic Neg liver ROS, GERD-  Medicated and Controlled,  Endo/Other  diabetes, Well Controlled, Oral Hypoglycemic AgentsMorbid obesity  Renal/GU negative Renal ROS  negative genitourinary   Musculoskeletal   Abdominal   Peds  Hematology negative hematology ROS (+)   Anesthesia Other Findings Past Medical History:   Diabetes mellitus without complication                       Hypertension                                                 Elevated cholesterol                                         Reproductive/Obstetrics negative OB ROS                             Anesthesia Physical Anesthesia Plan  ASA: III  Anesthesia Plan: General and Regional   Post-op Pain Management:    Induction:   Airway Management Planned:   Additional Equipment:   Intra-op Plan:   Post-operative Plan:   Informed Consent: I have reviewed the patients History and Physical, chart, labs and discussed the procedure including the  risks, benefits and alternatives for the proposed anesthesia with the patient or authorized representative who has indicated his/her understanding and acceptance.   Dental Advisory Given  Plan Discussed with: Anesthesiologist, CRNA and Surgeon  Anesthesia Plan Comments:         Anesthesia Quick Evaluation

## 2014-11-17 NOTE — H&P (Signed)
Paper H&P to be scanned into permanent record. H&P reviewed. No changes. 

## 2014-11-17 NOTE — Progress Notes (Signed)
ANTICOAGULATION CONSULT NOTE - Initial Consult  Pharmacy Consult for Lovenox Indication: VTE prophylaxis  No Known Allergies  Patient Measurements:   Heparin Dosing Weight:   Vital Signs: Temp: 98.6 F (37 C) (08/09 2009) Temp Source: Oral (08/09 2009) BP: 131/61 mmHg (08/09 2009) Pulse Rate: 80 (08/09 2009)  Labs:  Recent Labs  11/16/14 1045 11/16/14 1047  HGB  --  11.0*  HCT  --  33.8*  PLT  --  330  APTT 28  --   LABPROT 13.3  --   INR 0.99  --     CrCl cannot be calculated (Patient has no serum creatinine result on file.).   Medical History: Past Medical History  Diagnosis Date  . Diabetes mellitus without complication   . Hypertension   . Elevated cholesterol     Medications:  Prescriptions prior to admission  Medication Sig Dispense Refill Last Dose  . amLODipine (NORVASC) 10 MG tablet Take 10 mg by mouth every morning.    11/17/2014 at Unknown time  . losartan (COZAAR) 50 MG tablet Take 50 mg by mouth every morning. 1.5 tablets every am   11/17/2014 at Unknown time  . metoprolol succinate (TOPROL-XL) 100 MG 24 hr tablet Take 100 mg by mouth every morning. Take with or immediately following a meal.   11/17/2014 at Unknown time  . omeprazole (PRILOSEC) 20 MG capsule Take 20 mg by mouth every morning.    11/17/2014 at Unknown time  . aspirin 81 MG tablet Take 81 mg by mouth every evening.    11/12/2014  . atorvastatin (LIPITOR) 20 MG tablet Take 20 mg by mouth daily at 6 PM.      . calcium carbonate (TUMS - DOSED IN MG ELEMENTAL CALCIUM) 500 MG chewable tablet Chew 3 tablets by mouth 2 (two) times daily with a meal.     . Calcium Citrate-Vitamin D (CALCIUM + D PO) Take 1 tablet by mouth every evening.     . furosemide (LASIX) 20 MG tablet Take 20 mg by mouth 3 (three) times a week. As needed for swelling     . hydrochlorothiazide (HYDRODIURIL) 25 MG tablet Take 25 mg by mouth every morning.      Marland Kitchen HYDROcodone-acetaminophen (NORCO/VICODIN) 5-325 MG per tablet Take 1-2  tablets by mouth every 4 (four) hours as needed for moderate pain or severe pain. 30 tablet 0   . metFORMIN (GLUCOPHAGE) 500 MG tablet Take by mouth 2 (two) times daily with a meal.     . Omega-3 Fatty Acids ([employer] FISH OIL) 1000 MG CAPS Take 1 capsule by mouth 2 (two) times daily.      . traMADol (ULTRAM) 50 MG tablet Take by mouth every 6 (six) hours as needed for moderate pain.       Assessment: BMI = 44.4   Goal of Therapy:  DVT prophylaxis   Plan:  Lovenox 30 mg SQ Q12H originally ordered.  Will adjust dose to lovenox 40 mg SQ Q12H based on BMI > 44.4.   Alicia [name] D 11/17/2014,8:17 PM

## 2014-11-18 ENCOUNTER — Encounter: Payer: Self-pay | Admitting: Surgery

## 2014-11-18 LAB — BASIC METABOLIC PANEL
Anion gap: 8 (ref 5–15)
BUN: 9 mg/dL (ref 6–20)
CO2: 30 mmol/L (ref 22–32)
Calcium: 8.4 mg/dL — ABNORMAL LOW (ref 8.9–10.3)
Chloride: 99 mmol/L — ABNORMAL LOW (ref 101–111)
Creatinine, Ser: 0.69 mg/dL (ref 0.44–1.00)
GFR calc Af Amer: >60 mL/min (ref >60)
GFR calc non Af Amer: >60 mL/min (ref >60)
GLUCOSE: 195 mg/dL — AB (ref 65–99)
POTASSIUM: 4 mmol/L (ref 3.5–5.1)
SODIUM: 137 mmol/L (ref 135–145)

## 2014-11-18 LAB — CBC WITH DIFFERENTIAL/PLATELET
BASOS ABS: 0 10*3/uL (ref 0–0.1)
Basophils Relative: 0 %
EOS PCT: 0 %
Eosinophils Absolute: 0 10*3/uL (ref 0–0.7)
HEMATOCRIT: 29.5 % — AB (ref 35.0–47.0)
Hemoglobin: 9.6 g/dL — ABNORMAL LOW (ref 12.0–16.0)
LYMPHS ABS: 1 10*3/uL (ref 1.0–3.6)
LYMPHS PCT: 10 %
MCH: 27.1 pg (ref 26.0–34.0)
MCHC: 32.5 g/dL (ref 32.0–36.0)
MCV: 83.5 fL (ref 80.0–100.0)
MONO ABS: 0.4 10*3/uL (ref 0.2–0.9)
Monocytes Relative: 4 %
Neutro Abs: 8.5 10*3/uL — ABNORMAL HIGH (ref 1.4–6.5)
Neutrophils Relative %: 86 %
Platelets: 274 10*3/uL (ref 150–440)
RBC: 3.53 MIL/uL — AB (ref 3.80–5.20)
RDW: 15.7 % — AB (ref 11.5–14.5)
WBC: 9.9 10*3/uL (ref 3.6–11.0)

## 2014-11-18 LAB — GLUCOSE, CAPILLARY
GLUCOSE-CAPILLARY: 176 mg/dL — AB (ref 65–99)
GLUCOSE-CAPILLARY: 120 mg/dL — AB (ref 65–99)
Glucose-Capillary: 135 mg/dL — ABNORMAL HIGH (ref 65–99)
Glucose-Capillary: 130 mg/dL — ABNORMAL HIGH (ref 65–99)

## 2014-11-18 MED ORDER — CYCLOBENZAPRINE HCL 5 MG PO TABS
7.5000 mg | ORAL_TABLET | Freq: Three times a day (TID) | ORAL | Status: DC | PRN
  Administered 2014-11-18 – 2014-11-19 (×3): 7.5 mg via ORAL
  Filled 2014-11-18 (×5): qty 1.5

## 2014-11-18 MED ORDER — FLUTICASONE PROPIONATE 50 MCG/ACT NA SUSP
2.0000 | Freq: Every day | NASAL | Status: DC
  Administered 2014-11-18 – 2014-11-20 (×2): 2 via NASAL
  Filled 2014-11-18: qty 16

## 2014-11-18 NOTE — Progress Notes (Signed)
Educated and instructed on use of Facilities manager.  Patient only able to achieve approximately 150 ml.

## 2014-11-18 NOTE — Discharge Instructions (Signed)
Shoulder Joint Replacement, Care After °Refer to this sheet in the next few weeks. These instructions provide you with information on caring for yourself after your procedure. Your health care provider may also give you more specific instructions. Your treatment has been planned according to current medical practices, but problems sometimes occur. Call your health care provider if you have any problems or questions after your procedure. °WHAT TO EXPECT AFTER THE PROCEDURE °After your procedure, your arm and shoulder will typically be stiff and bruised. This will improve over time. °HOME CARE INSTRUCTIONS  °· You may resume your normal diet and activities as directed by your surgeon. °· You should regain full use of your shoulder in 6 weeks. °· Your arm will be in a sling. You will need to wear this for 4-6 weeks after surgery. °· Wear the sling every night for at least the first month, or as instructed by your surgeon. °· Do not use your arm to push yourself up in bed or from a chair. This requires too much muscle. °· Follow the program of home exercises suggested. Do the exercises 4-5 times a day for a month or as directed. °· Try not to overuse your shoulder. Overusing the shoulder is easy to do if this is the first time you have been pain free in a long time. Early overuse of the shoulder may result in later problems. °· Do not lift anything heavier than a cup of coffee for the first 6 weeks after surgery. °· Ask for help. Your health care provider may be able to suggest a clinic or agency for this if you do not have home support. °· Do not participate in contact sports or do any heavy lifting (more than 10 lb [4.5 kg]) for at least 6 months, or as directed. °· Apply ice to the injured area for the first 2 days after surgery: °¨ Put ice in a plastic bag. °¨ Place a towel between your skin and the bag. °¨ Leave the ice on for 20 minutes, 2-3 times a day. °· Change dressings if necessary or as directed. °· Only  take over-the-counter or prescription medicines for pain, discomfort, or fever as directed by your health care provider. °· Keep all follow-up appointments as directed. °SEEK MEDICAL CARE IF: °· You have redness, swelling, or increasing pain in the wound. °· You see pus coming from the wound. °· You have a fever. °· You notice a bad smell coming from the wound or dressing. °· The edges of the wound break open after sutures or staples have been removed. °· You have increasing pain with movement of the shoulder. °SEEK IMMEDIATE MEDICAL CARE IF:  °· You develop a rash. °· You have chest pain or shortness of breath. °· You have any reaction or side effects to medicine given. °MAKE SURE YOU: °· Understand these instructions. °· Will watch your condition. °· Will get help right away if you are not doing well or get worse. °Document Released: 10/14/2004 Document Revised: 04/01/2013 Document Reviewed: 10/24/2012 °ExitCare® Patient Information ©2015 [employer]. This information is not intended to replace advice given to you by your health care provider. Make sure you discuss any questions you have with your health care provider. ° °

## 2014-11-18 NOTE — Care Management Note (Signed)
Case Management Note  Patient Details  Name: Marty Sadlowski MRN: [id number] Date of Birth: 05/17/1940  Subjective/Objective:                  Met with patient to discuss discharge planning. She would like to go to [facility].SNF. She states she lives alone but ordinarily independent with ambulation. She is complaining of pain in her shoulder and with her Foley cath per PT. They are recommending SNF currently; patient sat on side of bed. Action/Plan: List of home health providers left with patient. RNCM will continue to follow.   Expected Discharge Date:                  Expected Discharge Plan:     In-House Referral:  Clinical Social Work  Discharge planning Services  CM Consult  Post Acute Care Choice:  Home Health Choice offered to:  Patient  DME Arranged:    DME Agency:     HH Arranged:  PT (list provided) [place] Agency:     Status of Service:  In process, will continue to follow  Medicare Important Message Given:    Date Medicare IM Given:    Medicare IM give by:    Date Additional Medicare IM Given:    Additional Medicare Important Message give by:     If discussed at [place] of Stay Meetings, dates discussed:    Additional Comments:  Marshell Garfinkel, RN 11/18/2014, 1:55 PM

## 2014-11-18 NOTE — Clinical Social Work Note (Signed)
Clinical Social Work Assessment  Patient Details  Name: Alicia [name] MRN: [id number] Date of Birth: 08/29/1940  Date of referral:  11/18/14               Reason for consult:  Facility Placement                Permission sought to share information with:  Family Supports Permission granted to share information::  Yes, Verbal Permission Granted  Name::        Agency::     Relationship::     Contact Information:     Housing/Transportation Living arrangements for the past 2 months:  Apartment Source of Information:  Patient, Other (Comment Required) (sibling) Patient Interpreter Needed:  None Criminal Activity/Legal Involvement Pertinent to Current Situation/Hospitalization:  No - Comment as needed Significant Relationships:  Siblings Lives with:  Self Do you feel safe going back to the place where you live?  Yes Need for family participation in patient care:  Yes (Comment)  Care giving concerns:  Patient lives alone   Facilities manager / plan:  CSW met with patient this afternoon along with her sister. Patient is aware that the recommendation from PT is for rehab currently. Patient wishes to go to [facility] if possible and is agreeable for a bedsearch. Patient states she lives alone and was independent in ADL's prior to surgery. Bedsearch initiated.  Employment status:  Retired Forensic scientist:  Medicare PT Recommendations:  [place] / Referral to community resources:  [place]  Patient/Family's Response to care:  agreeable  Patient/Family's Understanding of and Emotional Response to Diagnosis, Current Treatment, and Prognosis:  Currently believes she is unable to return home alone.   Emotional Assessment Appearance:  Appears stated age Attitude/Demeanor/Rapport:   (pleasant) Affect (typically observed):  Accepting, Adaptable, Appropriate Orientation:  Oriented to Self, Oriented to Place, Oriented to  Time, Oriented to  Situation Alcohol / Substance use:  Not Applicable Psych involvement (Current and /or in the community):  No (Comment)  Discharge Needs  Concerns to be addressed:  Care Coordination Readmission within the last 30 days:  No Current discharge risk:  None Barriers to Discharge:  No Barriers Identified   Shela Leff, LCSW 11/18/2014, 4:20 PM

## 2014-11-18 NOTE — Progress Notes (Signed)
  Subjective: 1 Day Post-Op Procedure(s) (LRB): SHOULDER HEMI-ARTHROPLASTY (Left) Patient reports pain as 10 on 0-10 scale.   Patient is well, but has had some minor complaints of pain. Plan is to go Home after hospital stay. Negative for chest pain and shortness of breath Fever: no Gastrointestinal:Negative for nausea and vomiting  Objective: Vital signs in last 24 hours: Temp:  [98 F (36.7 C)-100.7 F (38.2 C)] 98.8 F (37.1 C) (08/10 0449) Pulse Rate:  [59-122] 85 (08/10 0449) Resp:  [18-24] 20 (08/10 0449) BP: (125-174)/(47-68) 136/56 mmHg (08/10 0449) SpO2:  [93 %-100 %] 94 % (08/10 0449)  Intake/Output from previous day:  Intake/Output Summary (Last 24 hours) at 11/18/14 0727 Last data filed at 11/18/14 0451  Gross per 24 hour  Intake   1700 ml  Output   2700 ml  Net  -1000 ml    Intake/Output this shift:    Labs:  Recent Labs  11/16/14 1047 11/18/14 0545  HGB 11.0* 9.6*    Recent Labs  11/16/14 1047 11/18/14 0545  WBC 9.2 9.9  RBC 4.09 3.53*  HCT 33.8* 29.5*  PLT 330 274    Recent Labs  11/18/14 0545  NA 137  K 4.0  CL 99*  CO2 30  BUN 9  CREATININE 0.69  GLUCOSE 195*  CALCIUM 8.4*    Recent Labs  11/16/14 1045  INR 0.99     EXAM General - Patient is Alert, Appropriate and Oriented Extremity - Neurologically intact Sensation intact distally Intact pulses distally Dorsiflexion/Plantar flexion intact Incision: dressing C/D/I Dressing/Incision - clean, dry, no drainage Motor Function - intact, moving foot and toes well on exam.   Abdomen distended on exam without tympany.  Pt has normal BS.  Pt denies any N/V.  She has not had a BM.  Past Medical History  Diagnosis Date  . Diabetes mellitus without complication   . Hypertension   . Elevated cholesterol     Assessment/Plan: 1 Day Post-Op Procedure(s) (LRB): SHOULDER HEMI-ARTHROPLASTY (Left) Active Problems:   Status post reverse total shoulder replacement  There is  no weight on file to calculate BMI. Advance diet Up with therapy   Pt is complaining of severe pain in the left shoulder, states they feel like muscle spasms.  I have added flexeril. PT to evaluate patient today.  She has not had a BM. Denies any N/V or SOB.  I have encouraged Incentive spirometry usage. Foley still in place, will be removed today.  DVT Prophylaxis - Lovenox, Foot Pumps and TED hose No lifting weight with the left arm.  Valeria Batman, PA-C [facility] 11/18/2014, 7:27 AM

## 2014-11-18 NOTE — Progress Notes (Signed)
   11/18/14 1500  Clinical Encounter Type  Visited With Patient and family together  Visit Type Spiritual support  Referral From Other (Comment) (clerk phone call)  Consult/Referral To Chaplain  Spiritual Encounters  Spiritual Needs Literature  Stress Factors  Patient Stress Factors Health changes  Advance Directives (For Healthcare)  Does patient have an advance directive? No  Would patient like information on creating an advanced directive? Yes - Transport planner given   Status: good spirits/ 74 female/Status post reverse total shoulder replacement Faith: [employer] Family: Sister bedside Visit Assessment: The chaplain and notary followed up with patient but the patient was drowsy and in room with occupational therapy. The patient will notify the nursing station when she's alert and ready for advance directives.  Pastoral care: [phone] or available through online request

## 2014-11-18 NOTE — Evaluation (Signed)
Physical Therapy Evaluation Patient Details Name: Alicia [name] MRN: [id number] DOB: 1940/08/01 Today's Date: 11/18/2014   History of Present Illness  Pt is a 74 yo female who was admitted to the hospital s/p reverse L total shoulder replacement   Clinical Impression  Pt presents with hx of DM, HTN, and elevated cholesterol. Examination reveals that pt performs bed mobility at max assist. Transfers and ambulation were not tolerated by the pt secondary to pain. Major source of pain this AM came from the foley site. Nurse was notified of pain limiting PT. She was able to tolerate mild therex of wrist and elbow, and was educated on precautions and breathing techniques. Due to her decreased overall mobility, and ROM and strength deficits in shoulder, pt will continue to benefit from skilled PT in order to return her to premorbid state. Pt was on 1L [place] when PT entered. On room air at rest she was at 94% O2sat; therefore [place] was left off pt and nursing was notified.     Follow Up Recommendations SNF    Equipment Recommendations   (TBD)    Recommendations for Other Services       Precautions / Restrictions Precautions Precautions: Shoulder Type of Shoulder Precautions: NWB, no AROM Shoulder Interventions: Shoulder sling/immobilizer Required Braces or Orthoses:  (shoulder sling) Restrictions Weight Bearing Restrictions: Yes LUE Weight Bearing: Non weight bearing      Mobility  Bed Mobility Overal bed mobility: Needs Assistance Bed Mobility: Supine to Sit     Supine to sit: Max assist     General bed mobility comments: Pt c/o pain through entire bed mobility. Pain reported at foley site and in shoulder. Pt requires trunk support and LE management. Pt not able to tolerate anything beyond bed mobility. Pt sat EOB before returning to supine  Transfers Overall transfer level:  (Not tolerable this AM)                  Ambulation/Gait Ambulation/Gait assistance:  (Not tolerable  this AM)              Stairs            Wheelchair Mobility    Modified Rankin (Stroke Patients Only)       Balance Overall balance assessment: History of Falls                                           Pertinent Vitals/Pain Pain Assessment: 0-10 Pain Score: 8  Pain Location: L shoulder Pain Intervention(s): Limited activity within patient's tolerance;Monitored during session;Premedicated before session;Ice applied    Home Living Family/patient expects to be discharged to:: Skilled nursing facility                      Prior Function Level of Independence: Independent         Comments: Pt was indep with ADLs, driving, and community ambulation      Hand Dominance        Extremity/Trunk Assessment   Upper Extremity Assessment: LUE deficits/detail       LUE Deficits / Details: Pain and decreased ROM.    Lower Extremity Assessment: Overall WFL for tasks assessed         Communication   Communication: No difficulties  Cognition Arousal/Alertness: Awake/alert Behavior During Therapy: WFL for tasks assessed/performed Overall Cognitive Status: Within Functional Limits for  tasks assessed                      General Comments      Exercises Other Exercises Other Exercises: Pt performed therex in LUE x 10 reps with min assist for support of arm and facilitation of movement. Exercises performed: finger extension/flexion, wrist flexion/extension, elbow flexion/extension (short range) (x10 min)      Assessment/Plan    PT Assessment Patient needs continued PT services  PT Diagnosis Acute pain;Difficulty walking   PT Problem List Decreased strength;Decreased range of motion;Decreased activity tolerance;Decreased mobility;Decreased knowledge of use of DME;Decreased safety awareness;Decreased knowledge of precautions;Pain  PT Treatment Interventions DME instruction;Gait training;Stair training;Functional mobility  training;Therapeutic activities;Therapeutic exercise;Balance training;Neuromuscular re-education;Cognitive remediation;Patient/family education   PT Goals (Current goals can be found in the Care Plan section) Acute Rehab PT Goals Patient Stated Goal: to go to SNF PT Goal Formulation: With patient Time For Goal Achievement: 12/02/14 Potential to Achieve Goals: Good    Frequency BID   Barriers to discharge        Co-evaluation               End of Session   Activity Tolerance: Patient limited by pain Patient left: in bed;with call bell/phone within reach;with bed alarm set;with SCD's reapplied Nurse Communication: Mobility status;Patient requests pain meds         Time: 0940-1011 PT Time Calculation (min) (ACUTE ONLY): 31 min   Charges:         PT G CodesBenna Dunks 2014-11-29, 1:32 PM  Benna Dunks, SPT. [phone]

## 2014-11-18 NOTE — Progress Notes (Signed)
Physical Therapy Treatment Patient Details Name: Alicia [name] MRN: [id number] DOB: 21-Jun-1940 Today's Date: 11/18/2014    History of Present Illness Pt is a 74 yo female who was admitted to the hospital s/p reverse L total shoulder replacement     PT Comments    Pt performing much better this PM compared to this AM with significantly decreased pain and ability to perform mobility with PT (increased ambulation and mobility independence). She does still struggle with bed mobility and her endurance is not where it was at baseline. She continues to need reinforcement on what she can/cannot do with her LUE and the ramifications of those precautions. Pt will continue to benefit from skilled PT in order to return her to premorbid state.   Follow Up Recommendations  SNF     Equipment Recommendations  None recommended by PT    Recommendations for Other Services       Precautions / Restrictions Precautions Precautions: Shoulder Type of Shoulder Precautions: NWB, no AROM Shoulder Interventions: Shoulder sling/immobilizer Precaution Booklet Issued:  (exercise booklet issued) Precaution Comments: Pt needs reinforcement on precautions Required Braces or Orthoses: Sling Restrictions Weight Bearing Restrictions: Yes LUE Weight Bearing: Non weight bearing    Mobility  Bed Mobility Overal bed mobility: Needs Assistance Bed Mobility: Supine to Sit     Supine to sit: Mod assist     General bed mobility comments: Pt requires some trunk support and LE management. She needs cueing not to attempt use her LUE for propping or actively trying to swing her bodyweight in order to get to EOB  Transfers Overall transfer level: Needs assistance Equipment used: None Transfers: Sit to/from Stand Sit to Stand: Min guard;From elevated surface         General transfer comment: Pt slightly impulsive and will try to move before PT is ready. Needs cues to slow down. Shows generally good functional  strength but requires additional time to complete task.   Ambulation/Gait Ambulation/Gait assistance: Min guard Ambulation Distance (Feet): 60 Feet Assistive device: None Gait Pattern/deviations: Narrow base of support Gait velocity: decreased   General Gait Details: Pt ambulates with increased time and generally WFL. She requires CGA within the hospital secondary to hx of falls.    Stairs            Wheelchair Mobility    Modified Rankin (Stroke Patients Only)       Balance Overall balance assessment: History of Falls                                  Cognition Arousal/Alertness: Awake/alert Behavior During Therapy: WFL for tasks assessed/performed Overall Cognitive Status: Within Functional Limits for tasks assessed                      Exercises Other Exercises Other Exercises: Pt performed therex in LUE x 12 reps with min assist for support of arm and facilitation of movement. Exercises performed: finger extension/flexion, wrist flexion/extension, elbow flexion/extension (short range) (Instruction provided for HEP at d/c)    General Comments        Pertinent Vitals/Pain Pain Assessment: No/denies pain     Home Living Family/patient expects to be discharged to:: Skilled nursing facility                    Prior Function Level of Independence: Independent      Comments: Pt was indep with  ADLs, driving, and community ambulation    PT Goals (current goals can now be found in the care plan section) Acute Rehab PT Goals Patient Stated Goal: To participate in therapy PT Goal Formulation: With patient Time For Goal Achievement: 12/02/14 Potential to Achieve Goals: Good Progress towards PT goals: Progressing toward goals    Frequency  BID    PT Plan Current plan remains appropriate    Co-evaluation             End of Session Equipment Utilized During Treatment: Gait belt Activity Tolerance: Patient tolerated  treatment well Patient left: in bed;with call bell/phone within reach;with bed alarm set;with SCD's reapplied;with family/visitor present     Time: 4098-1191 PT Time Calculation (min) (ACUTE ONLY): 23 min  Charges:  $Therapeutic Exercise: 8-22 mins                    G CodesBenna Dunks 2014-11-28, 3:42 PM  Benna Dunks, SPT. [phone]

## 2014-11-18 NOTE — Anesthesia Postprocedure Evaluation (Signed)
  Anesthesia Post-op Note  Patient: Alicia [name]  Procedure(s) Performed: Procedure(s): SHOULDER HEMI-ARTHROPLASTY (Left)  Anesthesia type:General, Regional  Patient location: 153  Post pain: Pain level controlled  Post assessment: Post-op Vital signs reviewed, Patient's Cardiovascular Status Stable, Respiratory Function Stable, Patent Airway and No signs of Nausea or vomiting  Post vital signs: Reviewed and stable  Last Vitals:  Filed Vitals:   11/18/14 0743  BP: 130/98  Pulse: 85  Temp: 36.7 C  Resp: 20    Level of consciousness: awake, alert  and patient cooperative  Complications: No apparent anesthesia complications

## 2014-11-18 NOTE — Evaluation (Signed)
Occupational Therapy Evaluation Patient Details Name: Alicia [name] MRN: [id number] DOB: [birth year] Today's Date: 11/18/2014    History of Present Illness This patient is a 74 year old female who came to [facility] for a reverse L total shoulder replacement.   Clinical Impression   This patient is a 74 year old female who came to [facility] for a reverse L total shoulder replacement after an injury. She lives alone in a one floor apartment with no steps to enter. Before her injury a few weeks ago she had been independent with activities of daily living and functional mobility. She now needs assist and would benefit from Occupational Therapy for ADL/functioal mobility training and safety training.    Follow Up Recommendations  SNF    Equipment Recommendations    Reacher.   Recommendations for Other Services       Precautions / Restrictions Precautions Precautions: Shoulder Type of Shoulder Precautions: NWB, no AROM Shoulder Interventions: Shoulder sling/immobilizer (for left shoulder) Precaution Booklet Issued:  (exercise booklet issued) Precaution Comments: Pt needs reinforcement on precautions Required Braces or Orthoses: Sling Restrictions Weight Bearing Restrictions: Yes LUE Weight Bearing: Non weight bearing (L shoulder)      Mobility Bed Mobility   Transfers             Balance                                          ADL                                         General ADL Comments: Before injury patient independent. Now needs much assist. Discussed safety in home. Patient reports having wide hallways, a tub bench. Discussed use of reacher for ADL. Patient will not be able to use a sock aid as this requires 2 hands.      Vision     Perception     Praxis      Pertinent Vitals/Pain Pain Assessment: 0-10 Pain Score: 5  Pain Location: left shoulder Pain Intervention(s): Limited activity within  patient's tolerance;Premedicated before session     Hand Dominance     Extremity/Trunk Assessment Upper Extremity Assessment Upper Extremity Assessment:  (R UE 5/5 through out L UE not tested) LUE Deficits / Details: Pain and decreased ROM.  LUE: Unable to fully assess due to pain   Lower Extremity Assessment Lower Extremity Assessment: Defer to PT evaluation       Communication Communication Communication: No difficulties   Cognition Arousal/Alertness: Awake/alert Behavior During Therapy: WFL for tasks assessed/performed Overall Cognitive Status: Within Functional Limits for tasks assessed                     General Comments       Exercises Exercises: Other exercises Other Exercises Other Exercises: Pt performed therex in LUE x 10 reps with min assist for support of arm and facilitation of movement. Exercises performed: finger extension/flexion, wrist flexion/extension, elbow flexion/extension (short range) (Instruction provided for HEP at d/c)   Shoulder Instructions      Home Living Family/patient expects to be discharged to:: Skilled nursing facility  Additional Comments: Patient not safe to go home alone      Prior Functioning/Environment Level of Independence: Independent (before injury)        Comments: Pt was indep with ADLs, driving, and community ambulation     OT Diagnosis: Acute pain   OT Problem List: Decreased strength;Decreased range of motion;Decreased activity tolerance;Pain;Impaired UE functional use   OT Treatment/Interventions: Self-care/ADL training    OT Goals(Current goals can be found in the care plan section) Acute Rehab OT Goals Patient Stated Goal: To get some rehab OT Goal Formulation: With patient/family Time For Goal Achievement: 12/02/14 Potential to Achieve Goals: Good  OT Frequency: Min 1X/week   Barriers to D/C:            Co-evaluation              End of  Session Equipment Utilized During Treatment:  (Hip kit)  Activity Tolerance: Patient tolerated treatment well Patient left: in chair;with call bell/phone within reach;with chair alarm set;with family/visitor present   Time: 1530-1555 OT Time Calculation (min): 25 min Charges:  OT General Charges $OT Visit: 1 Procedure OT Evaluation $Initial OT Evaluation Tier I: 1 Procedure OT Treatments $Self Care/Home Management : 8-22 mins G-Codes:    [name], [name] Huff, MS/OTR/L  11/18/2014, 5:07 PM    

## 2014-11-18 NOTE — Progress Notes (Signed)
Patient foley removed and was able to void following removal.  Urine output excessive due to lasix.  Patient son and friends came to visit this shift.  Chaplain came by to set up a living will with patient per family and friends request.  Patient complains of pain this shift but does state the the Flexeril has helped.  Patient hopes to discharge to rehab facility since she is unable to do ADL's on her own and lives alone.  Family lives to far away to assist daily.

## 2014-11-18 NOTE — Discharge Summary (Signed)
Physician Discharge Summary  Patient ID: Alicia [name] MRN: [id number] DOB/AGE: Feb 25, 1941 74 y.o.  Admit date: 11/17/2014 Discharge date: 11/20/14  Admission Diagnoses:  CLOSED 4 PART FX.SURGICAL NECK LEFT HUMERUS   Discharge Diagnoses: Patient Active Problem List   Diagnosis Date Noted  . 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 Diabetes mellitus without complication   . Hypertension   . Elevated cholesterol      Transfusion: None   Consultants (if any):    Discharged Condition: Improved  Hospital Course: Beverlie Kurihara is an 74 y.o. female who was admitted 11/17/2014 with a diagnosis of a displaced 4-part proximal humerus fracture of the left shoulder  and went to the operating room on 11/17/2014 and underwent the above named procedures.    Surgeries: Procedure(s): SHOULDER HEMI-ARTHROPLASTY on 11/17/2014 Patient tolerated the surgery well. Taken to PACU where she was stabilized and then transferred to the orthopedic floor.  Started on Lovenox  q 12 hrs. Foot pumps applied bilaterally at 80 mm. Heels elevated on bed with rolled towels. No evidence of DVT. Negative Homan. Physical therapy started on day #1 for gait training and transfer. OT started day #1 for ADL and assisted devices.  Patient's IV , foley was d/c on POD1   Implants: Biomet Comprehensive system using a #8 cemented fracture humeral stem and a 10 mm centralizer with a #2 cement restrictor, a 44 mm humeral tray with a standard insert, and a mini-base plate with a 36 mm glenosphere.  She was given perioperative antibiotics:  Anti-infectives    Start     Dose/Rate Route Frequency Ordered Stop   11/17/14 2015  ceFAZolin (ANCEF) IVPB 2 g/50 mL premix     2 g 100 mL/hr over 30 Minutes Intravenous Every 6 hours 11/17/14 2009 11/18/14 1414   11/17/14 1315  ceFAZolin (ANCEF) IVPB 2 g/50 mL premix      2 g 100 mL/hr over 30 Minutes Intravenous  Once 11/17/14 1308 11/17/14 1515   11/17/14 1156  ceFAZolin (ANCEF) 2-3 GM-% IVPB SOLR    Comments:  Eli Hose: cabinet override      11/17/14 1156 11/17/14 2359    .  She was given sequential compression devices, early ambulation, and Lovenox for DVT prophylaxis.  She benefited maximally from the hospital stay and there were no complications.    Recent vital signs:  Filed Vitals:   11/18/14 1629  BP: 138/54  Pulse: 77  Temp: 98.1 F (36.7 C)  Resp: 18    Recent laboratory studies:  Lab Results  Component Value Date   HGB 9.6* 11/18/2014   HGB 11.0* 11/16/2014   Lab Results  Component Value Date   WBC 9.9 11/18/2014   PLT 274 11/18/2014   Lab Results  Component Value Date   INR 0.99 11/16/2014   Lab Results  Component Value Date   NA 137 11/18/2014   K 4.0 11/18/2014   CL 99* 11/18/2014   CO2 30 11/18/2014   BUN 9 11/18/2014   CREATININE 0.69 11/18/2014   GLUCOSE 195* 11/18/2014    Discharge Medications:     Medication List    ASK your doctor about these medications        amLODipine 10 MG tablet  Commonly known as:  NORVASC  Take 10 mg by mouth every morning.     aspirin 81 MG tablet  Take 81 mg by mouth every evening.  atorvastatin 20 MG tablet  Commonly known as:  LIPITOR  Take 20 mg by mouth daily at 6 PM.     BAYER CONTOUR TEST test strip  Generic drug:  glucose blood  1 strip by Other route as needed.     CALCIUM + D PO  Take 1 tablet by mouth every evening.     calcium carbonate 500 MG chewable tablet  Commonly known as:  TUMS - dosed in mg elemental calcium  Chew 3 tablets by mouth 2 (two) times daily with a meal.     [employer] FISH OIL 1000 MG Caps  Take 1 capsule by mouth 2 (two) times daily.     fluticasone 50 MCG/ACT nasal spray  Commonly known as:  FLONASE  Place 2 sprays into both nostrils daily.     furosemide 20 MG tablet  Commonly known as:  LASIX  Take 20 mg by  mouth 3 (three) times a week. As needed for swelling     hydrochlorothiazide 25 MG tablet  Commonly known as:  HYDRODIURIL  Take 25 mg by mouth every morning.     HYDROcodone-acetaminophen 5-325 MG per tablet  Commonly known as:  NORCO/VICODIN  Take 1-2 tablets by mouth every 4 (four) hours as needed for moderate pain or severe pain.     losartan 50 MG tablet  Commonly known as:  COZAAR  Take 50 mg by mouth every morning. 1.5 tablets every am     metFORMIN 500 MG tablet  Commonly known as:  GLUCOPHAGE  Take by mouth 2 (two) times daily with a meal.     metoprolol succinate 100 MG 24 hr tablet  Commonly known as:  TOPROL-XL  Take 100 mg by mouth every morning. Take with or immediately following a meal.     MICROLET LANCETS Misc  1 Stick by Other route daily.     omeprazole 20 MG capsule  Commonly known as:  PRILOSEC  Take 20 mg by mouth every morning.     traMADol 50 MG tablet  Commonly known as:  ULTRAM  Take by mouth every 6 (six) hours as needed for moderate pain.        Diagnostic Studies: Ct Shoulder Left Wo Contrast  11/11/2014   CLINICAL DATA:  Status post fall 5 days ago with a proximal left humerus fracture. Pain.  EXAM: CT OF THE LEFT SHOULDER WITHOUT CONTRAST  TECHNIQUE: Multidetector CT imaging was performed according to the standard protocol. Multiplanar CT image reconstructions were also generated.  COMPARISON:  Plain film 11/06/2014.  FINDINGS: As seen on plain films, the patient has a comminuted fracture the proximal humerus. The main component of the fracture is transverse through the surgical neck with 1 shaft with anterior displacement of the diaphysis of the humerus and fragment override of approximately 2.5 cm. The fracture involves both the greater and lesser tuberosities and splits the humeral head. The greater tuberosity and posterior humeral head are superiorly displaced 1.3 cm and posteriorly displaced 1.2 cm. No other fracture is identified. The  acromioclavicular joint is intact. The rotator cuff appears intact.  IMPRESSION: Comminuted, complex fracture proximal left humerus has an appearance most consistent with an AO 11-C2.3 injury.   Electronically Signed   By: Drusilla Kanner M.D.   On: 11/11/2014 15:56   Dg Shoulder Left  11/06/2014   CLINICAL DATA:  Fall with left shoulder injury and pain today. Initial encounter.  EXAM: LEFT SHOULDER - 2+ VIEW  COMPARISON:  None.  FINDINGS: A  comminuted fracture of the left humeral head an neck identified. Subluxation of the humeral head is noted.  Apex anterior angulation is noted.  Displacement of some fracture fragments identified.  IMPRESSION: Comminuted fracture of the humeral head/neck with some displacement and mild angulation.   Electronically Signed   By: Harmon Pier M.D.   On: 11/06/2014 17:42   Dg Shoulder Left Port  11/17/2014   CLINICAL DATA:  Status post total shoulder replacement  EXAM: LEFT SHOULDER - 1 VIEW  COMPARISON:  Left shoulder CT November 11, 2014  FINDINGS: Frontal view obtained. There is a total shoulder replacement on the left with prosthetic components appearing well-seated. The humeral component transfixes a comminuted fracture of the proximal humerus with several displaced fracture fragments. No dislocation. Left lung appears clear.  IMPRESSION: Prosthetic components appear well seated on the left. Comminuted fracture proximal humeral metaphysis with displaced fracture fragments remain. No dislocation.   Electronically Signed   By: Bretta Bang III M.D.   On: 11/17/2014 19:46    Disposition: Pt is stable and ready for discharge.  Foley was removed on POD1 and she was able to void well while here in the hospital.  PT/OT are recommending SNF placement following discharge, which the patient is agreeable to.  The Pt has chosen a bed at [facility].       Follow-up Information    Follow up with Meriel Pica, PA-C In 10 days.   Specialty:  Physician Assistant   Why:  For  staple removal, For wound re-check   Contact information:   [address] [place] [address] [phone]       Signed: Meriel Pica  PA-C 11/18/2014, 7:19 PM

## 2014-11-19 LAB — BASIC METABOLIC PANEL
Anion gap: 10 (ref 5–15)
BUN: 12 mg/dL (ref 6–20)
CO2: 34 mmol/L — AB (ref 22–32)
Calcium: 8.7 mg/dL — ABNORMAL LOW (ref 8.9–10.3)
Chloride: 91 mmol/L — ABNORMAL LOW (ref 101–111)
Creatinine, Ser: 0.65 mg/dL (ref 0.44–1.00)
GFR calc Af Amer: >60 mL/min (ref >60)
GFR calc non Af Amer: >60 mL/min (ref >60)
Glucose, Bld: 126 mg/dL — ABNORMAL HIGH (ref 65–99)
POTASSIUM: 3.3 mmol/L — AB (ref 3.5–5.1)
Sodium: 135 mmol/L (ref 135–145)

## 2014-11-19 LAB — GLUCOSE, CAPILLARY
GLUCOSE-CAPILLARY: 119 mg/dL — AB (ref 65–99)
GLUCOSE-CAPILLARY: 86 mg/dL (ref 65–99)
Glucose-Capillary: 123 mg/dL — ABNORMAL HIGH (ref 65–99)
Glucose-Capillary: 118 mg/dL — ABNORMAL HIGH (ref 65–99)

## 2014-11-19 LAB — SURGICAL PATHOLOGY

## 2014-11-19 MED ORDER — OXYCODONE HCL 5 MG PO TABS: 5.0000 mg | ORAL_TABLET | ORAL | Status: DC | PRN

## 2014-11-19 MED ORDER — POTASSIUM CHLORIDE CRYS ER 20 MEQ PO TBCR
20.0000 meq | EXTENDED_RELEASE_TABLET | Freq: Two times a day (BID) | ORAL | Status: AC
  Administered 2014-11-19: 20 meq via ORAL
  Filled 2014-11-19: qty 1

## 2014-11-19 MED ORDER — CYCLOBENZAPRINE HCL 7.5 MG PO TABS: 7.5000 mg | ORAL_TABLET | Freq: Three times a day (TID) | ORAL | Status: DC | PRN

## 2014-11-19 NOTE — Care Management Important Message (Signed)
Important Message  Patient Details  Name: Alicia [name] MRN: [id number] Date of Birth: [birth year]   Medicare Important Message Given:  Yes-second notification given    [name] 11/19/2014, 9:40 AM

## 2014-11-19 NOTE — Plan of Care (Signed)
Problem: Phase I Progression Outcomes Goal: Pain controlled with appropriate interventions Outcome: Not Progressing Patient experiences on going pain

## 2014-11-19 NOTE — Progress Notes (Signed)
  Subjective: 2 Days Post-Op Procedure(s) (LRB): SHOULDER HEMI-ARTHROPLASTY (Left) Patient reports pain as 6 on 0-10 scale.   Patient is well, and has had no acute complaints or problems Plan is to go Skilled nursing facility after hospital stay. Negative for chest pain and shortness of breath Fever: no Gastrointestinal:Negative for nausea and vomiting  Objective: Vital signs in last 24 hours: Temp:  [97.9 F (36.6 C)-98.1 F (36.7 C)] 97.9 F (36.6 C) (08/11 0434) Pulse Rate:  [74-85] 74 (08/11 0434) Resp:  [18-20] 18 (08/11 0434) BP: (130-138)/(42-98) 137/42 mmHg (08/11 0434) SpO2:  [92 %-95 %] 94 % (08/11 0434) Weight:  [113.399 kg (250 lb)] 113.399 kg (250 lb) (08/10 0814)  Intake/Output from previous day:  Intake/Output Summary (Last 24 hours) at 11/19/14 0742 Last data filed at 11/18/14 1958  Gross per 24 hour  Intake 1432.5 ml  Output   2050 ml  Net -617.5 ml    Intake/Output this shift:    Labs:  Recent Labs  11/16/14 1047 11/18/14 0545  HGB 11.0* 9.6*    Recent Labs  11/16/14 1047 11/18/14 0545  WBC 9.2 9.9  RBC 4.09 3.53*  HCT 33.8* 29.5*  PLT 330 274    Recent Labs  11/18/14 0545 11/19/14 0619  NA 137 135  K 4.0 3.3*  CL 99* 91*  CO2 30 34*  BUN 9 12  CREATININE 0.69 0.65  GLUCOSE 195* 126*  CALCIUM 8.4* 8.7*    Recent Labs  11/16/14 1045  INR 0.99     EXAM General - Patient is Alert, Appropriate and Oriented Extremity - Neurologically intact ABD soft Intact pulses distally Incision: dressing C/D/I No cellulitis present Dressing/Incision - clean, dry, no drainage Motor Function - intact, moving foot and toes well on exam.   Abdomen is soft on exam.  Sensation intact to the left upper extremity.  Past Medical History  Diagnosis Date  . Diabetes mellitus without complication   . Hypertension   . Elevated cholesterol     Assessment/Plan: 2 Days Post-Op Procedure(s) (LRB): SHOULDER HEMI-ARTHROPLASTY (Left) Active  Problems:   Status post reverse total shoulder replacement  Estimated body mass index is 44.3 kg/(m^2) as calculated from the following:   Height as of this encounter:  (1.6 m).   Weight as of this encounter: 113.399 kg (250 lb). Advance diet Up with therapy   Pt pain is much better than yesterday, states the Flexeril helped. K+ down to 3.3, ordered Klor-Con 20 meq BID today. Pt has urinated while in the hospital. Pt is unable to do much with PT, high risk of re-admition due to fall risk. Pt will stay for one more night, pending discharge to SNF tomorrow. Will re-check labs in the morning due to low K+  DVT Prophylaxis - Lovenox, Foot Pumps and TED hose No lifting with left upper extremity.  Valeria Batman, PA-C [facility] Orthopaedic Surgery 11/19/2014, 7:42 AM

## 2014-11-19 NOTE — Progress Notes (Signed)
Clinical Child psychotherapist (CSW) presented bed offers. Patient chose [facility]. CSW contacted [facility] admissions coordinator at [facility] and made her aware of accepted offer. Plan is for patient to D/C to [facility] tomorrow (Friday 11/20/14). CSW explained that patient needs a qualifying inpatient 3 night stay in order for Medicare to pay for rehab. Patient was admitted to inpatient on 11/17/14 her third night is tonight. CSW will continue to follow and assist as needed.   Jetta Lout, LCSWA [phone]

## 2014-11-19 NOTE — Clinical Social Work Placement (Signed)
   CLINICAL SOCIAL WORK PLACEMENT  NOTE  Date:  11/19/2014  Patient Details  Name: Alicia [name] MRN: [id number] Date of Birth: [birth year]  Clinical Social Work is seeking post-discharge placement for this patient at the Skilled  Nursing Facility level of care (*CSW will initial, date and re-position this form in  chart as items are completed):  Yes   Patient/family provided with [facility] Clinical Social Work Department's list of facilities offering this level of care within the geographic area requested by the patient (or if unable, by the patient's family).  Yes   Patient/family informed of their freedom to choose among providers that offer the needed level of care, that participate in Medicare, Medicaid or managed care program needed by the patient, have an available bed and are willing to accept the patient.  Yes   Patient/family informed of [facility]'s ownership interest in [facility] and [facility], as well as of the fact that they are under no obligation to receive care at these facilities.  PASRR submitted to EDS on       PASRR number received on       Existing PASRR number confirmed on 11/18/14     FL2 transmitted to all facilities in geographic area requested by pt/family on 11/18/14     FL2 transmitted to all facilities within larger geographic area on       Patient informed that his/her managed care company has contracts with or will negotiate with certain facilities, including the following:        Yes   Patient/family informed of bed offers received.  Patient chooses bed at  [facility] )     Physician recommends and patient chooses bed at      Patient to be transferred to   on  .  Patient to be transferred to facility by       Patient family notified on   of transfer.  Name of family member notified:        PHYSICIAN Please sign FL2     Additional Comment:    _______________________________________________ Haig Prophet,  LCSW 11/19/2014, 11:48 AM

## 2014-11-19 NOTE — Progress Notes (Signed)
Physical Therapy Treatment Patient Details Name: Alicia [name] MRN: [id number] DOB: 1940-08-22 Today's Date: 11/19/2014    History of Present Illness This patient is a 74 year old female who came to [facility] for a reverse L total shoulder replacement.    PT Comments    Pt in a bit of pain this AM (8/10) and appearing more labored in her mobility as well as breathing during activity.(reference ambulation for O2 sat). She continues to need assistance with bed mobility in particular and managing to complete the task safely without involving LUE. She is pleasant to work with and motivated to participate in therapy. She will continue to benefit from skilled PT in order to address her activity tolerance, ROM, and mobility deficits for an eventual safe return home.   Follow Up Recommendations  SNF     Equipment Recommendations  None recommended by PT    Recommendations for Other Services       Precautions / Restrictions Precautions Precautions: Shoulder Type of Shoulder Precautions: NWB, no AROM Shoulder Interventions: Shoulder sling/immobilizer Precaution Comments: Pt needs reinforcement on precautions Required Braces or Orthoses: Sling Restrictions Weight Bearing Restrictions: Yes LUE Weight Bearing: Non weight bearing    Mobility  Bed Mobility Overal bed mobility: Needs Assistance Bed Mobility: Supine to Sit     Supine to sit: Mod assist     General bed mobility comments: Pt requires some trunk support and LE management. She needs cueing not to attempt use her LUE for propping or actively trying to swing her bodyweight in order to get to EOB  Transfers Overall transfer level: Needs assistance Equipment used: Quad cane Transfers: Sit to/from Stand Sit to Stand: Min guard;From elevated surface         General transfer comment: Pt still requires CGA for safety. Mobility takes slightly more effort this morning with increased time to complete tasks. Pt prefers to use quad cane  this date when previously using no assistive device  Ambulation/Gait Ambulation/Gait assistance: Min guard Ambulation Distance (Feet): 60 Feet Assistive device: Quad cane Gait Pattern/deviations: Narrow base of support Gait velocity: decreased   General Gait Details: Pt ambulated same distance as yesterday. It was noted that pt has more labored breathing and her gait speed was a little slower today, again appearing more labored. O2 saturation after ambulation was 88%; came back up to 93% after 1 minute of resting in recliner.    Stairs            Wheelchair Mobility    Modified Rankin (Stroke Patients Only)       Balance Overall balance assessment: History of Falls                                  Cognition Arousal/Alertness: Awake/alert Behavior During Therapy: WFL for tasks assessed/performed Overall Cognitive Status: Within Functional Limits for tasks assessed                      Exercises Other Exercises Other Exercises: Pt performed therex in LUE x 12 reps with min assist for support of arm and facilitation of movement. Exercises performed: finger extension/flexion, wrist flexion/extension, elbow flexion/extension (short range)    General Comments General comments (skin integrity, edema, etc.): Swelling in L hand appearing more pronounced today. Pt states it has swollen further today.       Pertinent Vitals/Pain Pain Assessment: 0-10 Pain Score: 8  Pain Location: L  shoulder Pain Intervention(s): Limited activity within patient's tolerance;Monitored during session;Premedicated before session;Ice applied    Home Living                      Prior Function            PT Goals (current goals can now be found in the care plan section) Acute Rehab PT Goals Patient Stated Goal: to walk PT Goal Formulation: With patient Time For Goal Achievement: 12/02/14 Potential to Achieve Goals: Good Progress towards PT goals: PT to  reassess next treatment    Frequency  BID    PT Plan Current plan remains appropriate    Co-evaluation             End of Session Equipment Utilized During Treatment: Gait belt Activity Tolerance: Patient tolerated treatment well Patient left: in chair;with chair alarm set     Time: 1610-9604 PT Time Calculation (min) (ACUTE ONLY): 23 min  Charges:                       G CodesBenna Dunks November 25, 2014, 11:50 AM  Benna Dunks, SPT. [phone]

## 2014-11-19 NOTE — Progress Notes (Signed)
Physical Therapy Treatment Patient Details Name: Sheyann Sulton MRN: [id number] DOB: [birth year] Today's Date: 11/19/2014    History of Present Illness This patient is a 74 year old female who came to [facility] for a reverse L total shoulder replacement.    PT Comments    Patient complains of 10+ pain in L shoulder; notes she is uncomfortable as she is in the bed. Pt ambulated in room to the recliner. Pt comfortable in recliner with use of pillow to better support the arm. Encouraged pt to have pillow under upper arm/elbow for improved comfort as well. Patient requesting pain medication during session; nursing notifies patient she cannot have pain medication for another hour. Pt did not wish further exercise/walking at this time. Continue PT for progression of all functional mobility.   Follow Up Recommendations  SNF     Equipment Recommendations  None recommended by PT    Recommendations for Other Services       Precautions / Restrictions Precautions Precautions: Shoulder Type of Shoulder Precautions: NWB, no AROM Shoulder Interventions: Shoulder sling/immobilizer Required Braces or Orthoses: Sling Restrictions Weight Bearing Restrictions: Yes LUE Weight Bearing: Non weight bearing    Mobility  Bed Mobility Overal bed mobility: Needs Assistance Bed Mobility: Supine to Sit     Supine to sit: Min assist     General bed mobility comments: Increased time/effort  Transfers Overall transfer level: Needs assistance Equipment used: None Transfers: Sit to/from Stand Sit to Stand: Min guard            Ambulation/Gait Ambulation/Gait assistance: Min guard Ambulation Distance (Feet): 22 Feet Assistive device: None Gait Pattern/deviations: WFL(Within Functional Limits) Gait velocity: decreased Gait velocity interpretation: Below normal speed for age/gender General Gait Details: Walks slow/cautious. touches chair one time for security when ambulating around the  bed   Stairs            Wheelchair Mobility    Modified Rankin (Stroke Patients Only)       Balance Overall balance assessment: History of Falls                                  Cognition Arousal/Alertness: Awake/alert Behavior During Therapy: WFL for tasks assessed/performed Overall Cognitive Status: Within Functional Limits for tasks assessed                      Exercises      General Comments        Pertinent Vitals/Pain Pain Assessment: 0-10 Pain Score: 10-Worst pain ever Pain Location: L shoulder Pain Descriptors / Indicators: Constant;Aching Pain Intervention(s): Limited activity within patient's tolerance;Repositioned    Home Living                      Prior Function            PT Goals (current goals can now be found in the care plan section) Progress towards PT goals: Progressing toward goals    Frequency  BID    PT Plan Current plan remains appropriate    Co-evaluation             End of Session   Activity Tolerance: Patient tolerated treatment well (limited by pain currently) Patient left: in chair;with call bell/phone within reach;with chair alarm set;with nursing/sitter in room     Time: 1521-1539 PT Time Calculation (min) (ACUTE ONLY): 18 min  Charges:  $Gait Training: 8-22  mins                    G Codes:      Kristeen Miss 11/19/2014, 3:47 PM

## 2014-11-20 LAB — BASIC METABOLIC PANEL
ANION GAP: 9 (ref 5–15)
BUN: 13 mg/dL (ref 6–20)
CHLORIDE: 93 mmol/L — AB (ref 101–111)
CO2: 32 mmol/L (ref 22–32)
Calcium: 8.9 mg/dL (ref 8.9–10.3)
Creatinine, Ser: 0.68 mg/dL (ref 0.44–1.00)
GFR calc Af Amer: >60 mL/min (ref >60)
GFR calc non Af Amer: >60 mL/min (ref >60)
Glucose, Bld: 144 mg/dL — ABNORMAL HIGH (ref 65–99)
POTASSIUM: 3.5 mmol/L (ref 3.5–5.1)
Sodium: 134 mmol/L — ABNORMAL LOW (ref 135–145)

## 2014-11-20 LAB — GLUCOSE, CAPILLARY
Glucose-Capillary: 151 mg/dL — ABNORMAL HIGH (ref 65–99)
Glucose-Capillary: 112 mg/dL — ABNORMAL HIGH (ref 65–99)

## 2014-11-20 NOTE — Progress Notes (Signed)
   11/20/14 1250  Clinical Encounter Type  Visited With Patient  Visit Type Follow-up;Spiritual support  Referral From Nurse  Consult/Referral To Chaplain  Spiritual Encounters  Spiritual Needs Literature;Brochure  Stress Factors  Patient Stress Factors None identified  Chaplain was paged to the unit to provide a AD packet of information. Patient did not recall receiving one. Offered compassionate presence and assistance as applicable as well as education. Patient preferred that her sister was present to assist her in completing it. Not sure if she will be able to complete it during her stay Chaplain Carson Bogden A. Densil Ottey Ext. [phone]

## 2014-11-20 NOTE — Clinical Social Work Placement (Signed)
   CLINICAL SOCIAL WORK PLACEMENT  NOTE  Date:  11/20/2014  Patient Details  Name: Alicia [name] MRN: [id number] Date of Birth: 1940-11-28  Clinical Social Work is seeking post-discharge placement for this patient at the Skilled  Nursing Facility level of care (*CSW will initial, date and re-position this form in  chart as items are completed):  Yes   Patient/family provided with [facility] Department's list of facilities offering this level of care within the geographic area requested by the patient (or if unable, by the patient's family).  Yes   Patient/family informed of their freedom to choose among providers that offer the needed level of care, that participate in Medicare, Medicaid or managed care program needed by the patient, have an available bed and are willing to accept the patient.  Yes   Patient/family informed of [facility]'s ownership interest in [facility] and [facility], as well as of the fact that they are under no obligation to receive care at these facilities.  PASRR submitted to EDS on       PASRR number received on       Existing PASRR number confirmed on 11/18/14     FL2 transmitted to all facilities in geographic area requested by pt/family on 11/18/14     FL2 transmitted to all facilities within larger geographic area on       Patient informed that his/her managed care company has contracts with or will negotiate with certain facilities, including the following:        Yes   Patient/family informed of bed offers received.  Patient chooses bed at  [facility] )     Physician recommends and patient chooses bed at      Patient to be transferred to  [facility]) on 11/20/14.  Patient to be transferred to facility by [facility]     Patient family notified on 11/20/14 of transfer.  Name of family member notified:  Pt will notify friend     PHYSICIAN Please sign FL2     Additional Comment:     _______________________________________________ Ned Card, LCSW 11/20/2014, 1:10 PM

## 2014-11-20 NOTE — Progress Notes (Signed)
CSW was notified that Pt has been medically cleared for DC to SNF [facility]. CSW updated SNF and bed is ready for Pt. CSW prepared DC packet and sent summary.  RN to call report and EMS for transport.  Pt will notify support of DC.  She is pleased with dc plan and care while at the hospital.   Wilford Grist, LCSW [phone]

## 2014-11-20 NOTE — Progress Notes (Signed)
  Subjective: 3 Days Post-Op Procedure(s) (LRB): SHOULDER HEMI-ARTHROPLASTY (Left) Patient reports pain as mild.   Patient is well, and has had no acute complaints or problems Plan is to go Skilled nursing facility after hospital stay. Negative for chest pain and shortness of breath Fever: no Gastrointestinal:Negative for nausea and vomiting  Objective: Vital signs in last 24 hours: Temp:  [97.7 F (36.5 C)-99 F (37.2 C)] 97.7 F (36.5 C) (08/12 0400) Pulse Rate:  [73-81] 80 (08/12 0400) Resp:  [18] 18 (08/12 0400) BP: (133-137)/(41-57) 133/41 mmHg (08/12 0400) SpO2:  [93 %-96 %] 94 % (08/12 0400)  Intake/Output from previous day:  Intake/Output Summary (Last 24 hours) at 11/20/14 0703 Last data filed at 11/20/14 0400  Gross per 24 hour  Intake    600 ml  Output      0 ml  Net    600 ml    Intake/Output this shift:    Labs:  Recent Labs  11/18/14 0545  HGB 9.6*    Recent Labs  11/18/14 0545  WBC 9.9  RBC 3.53*  HCT 29.5*  PLT 274    Recent Labs  11/19/14 0619 11/20/14 0422  NA 135 134*  K 3.3* 3.5  CL 91* 93*  CO2 34* 32  BUN 12 13  CREATININE 0.65 0.68  GLUCOSE 126* 144*  CALCIUM 8.7* 8.9   No results for input(s): LABPT, INR in the last 72 hours.   EXAM General - Patient is Alert, Appropriate and Oriented Extremity - Neurologically intact ABD soft Dorsiflexion/Plantar flexion intact Incision: dressing C/D/I No cellulitis present Dressing/Incision - clean, dry, no drainage Motor Function - intact, moving foot and toes well on exam.   Abdomen is soft on exam.  Non-tender, non-distended. Sensation intact to the left upper extremity.  Past Medical History  Diagnosis Date  . Diabetes mellitus without complication   . Hypertension   . Elevated cholesterol     Assessment/Plan: 3 Days Post-Op Procedure(s) (LRB): SHOULDER HEMI-ARTHROPLASTY (Left) Active Problems:   Status post reverse total shoulder replacement  Estimated body mass  index is 44.3 kg/(m^2) as calculated from the following:   Height as of this encounter:  (1.6 m).   Weight as of this encounter: 113.399 kg (250 lb). Advance diet Up with therapy Discharge to SNF   Pt is stable and ready for discharge today.  DVT Prophylaxis - Lovenox, Foot Pumps and TED hose No lifting with the left arm.  Valeria Batman, PA-C [facility] Orthopaedic Surgery 11/20/2014, 7:03 AM

## 2014-11-20 NOTE — Progress Notes (Signed)
Report called to Gaspar Bidding at [facility]. Patient ready for discharge.

## 2014-11-20 NOTE — Progress Notes (Signed)
Physical Therapy Treatment Patient Details Name: Alicia [name] MRN: [id number] DOB: 1940/09/02 Today's Date: 11/20/2014    History of Present Illness This patient is a 74 year old female who came to [facility] for a reverse L total shoulder replacement.    PT Comments    Pt presented sitting in chair, reviewed HEP including finger ext/flexion, wrist flexion/extension, elbow flexion/ext.  Also did gentle LE therex incl LAQ and ankle DF/PF.  Amb to room sink as pt able to maintain F+ balance at sink while performing oral hygiene.  Pt ambulated approx 64ft with no AD but close to wall with occasional touching wall.  Discussed use of quad cane for safety as balance is decreased from PLOF.  Follow Up Recommendations  SNF     Equipment Recommendations  None recommended by PT    Recommendations for Other Services       Precautions / Restrictions Precautions Precautions: Shoulder Type of Shoulder Precautions: NWB, no AROM Shoulder Interventions: Shoulder sling/immobilizer Required Braces or Orthoses: Sling Restrictions Weight Bearing Restrictions: Yes LUE Weight Bearing: Non weight bearing    Mobility  Bed Mobility                  Transfers Overall transfer level: Needs assistance Equipment used: None Transfers: Sit to/from Stand Sit to Stand: Min guard            Ambulation/Gait Ambulation/Gait assistance: Min guard Ambulation Distance (Feet): 50 Feet Assistive device: None Gait Pattern/deviations: WFL(Within Functional Limits);Step-through pattern Gait velocity: decreased   General Gait Details: Pt with slow ambulation, occaionally touching wall when in corridor   Stairs            Wheelchair Mobility    Modified Rankin (Stroke Patients Only)       Balance Overall balance assessment: History of Falls                                  Cognition Arousal/Alertness: Awake/alert Behavior During Therapy: WFL for tasks  assessed/performed Overall Cognitive Status: Within Functional Limits for tasks assessed                      Exercises Other Exercises Other Exercises: review of HEP, finger ext/flexion, wrist flex/ext Other Exercises: LE, LAQ ankle DF/PF    General Comments General comments (skin integrity, edema, etc.): L hand swollen      Pertinent Vitals/Pain Pain Assessment: 0-10 Pain Score: 3  (at rest in loose pack position) Pain Location: L shoulder Pain Descriptors / Indicators: Constant Pain Intervention(s): Limited activity within patient's tolerance    Home Living                      Prior Function            PT Goals (current goals can now be found in the care plan section) Acute Rehab PT Goals PT Goal Formulation: With patient Time For Goal Achievement: 12/02/14 Potential to Achieve Goals: Good Progress towards PT goals: Progressing toward goals    Frequency  BID    PT Plan Current plan remains appropriate    Co-evaluation             End of Session Equipment Utilized During Treatment: Gait belt Activity Tolerance: Patient tolerated treatment well Patient left: in chair;with call bell/phone within reach;with chair alarm set     Time: 4098-1191 PT Time Calculation (min) (ACUTE  ONLY): 15 min  Charges:  $Gait Training: 8-22 mins                    G Codes:      Dixie Coppa 12/08/2014, 1:23 PM  Avital Dancy, PTA

## 2016-10-17 ENCOUNTER — Other Ambulatory Visit: Payer: Self-pay | Admitting: Medical Oncology

## 2016-10-17 DIAGNOSIS — Z1231 Encounter for screening mammogram for malignant neoplasm of breast: Secondary | ICD-10-CM

## 2016-10-23 ENCOUNTER — Ambulatory Visit
Admission: RE | Admit: 2016-10-23 | Discharge: 2016-10-23 | Disposition: A | Discharge status: Home / Self Care | Payer: Medicare Other | Source: Ambulatory Visit | Attending: Medical Oncology | Admitting: Medical Oncology

## 2016-10-23 DIAGNOSIS — Z1231 Encounter for screening mammogram for malignant neoplasm of breast: Secondary | ICD-10-CM | POA: Diagnosis present

## 2017-10-10 ENCOUNTER — Other Ambulatory Visit: Payer: Self-pay | Admitting: Medical Oncology

## 2017-10-10 DIAGNOSIS — Z1231 Encounter for screening mammogram for malignant neoplasm of breast: Secondary | ICD-10-CM

## 2017-11-06 ENCOUNTER — Encounter (INDEPENDENT_AMBULATORY_CARE_PROVIDER_SITE_OTHER): Payer: Self-pay

## 2017-11-06 ENCOUNTER — Ambulatory Visit
Admission: RE | Admit: 2017-11-06 | Discharge: 2017-11-06 | Disposition: A | Discharge status: Home / Self Care | Payer: Medicare Other | Source: Ambulatory Visit | Attending: Medical Oncology | Admitting: Medical Oncology

## 2017-11-06 DIAGNOSIS — Z1231 Encounter for screening mammogram for malignant neoplasm of breast: Secondary | ICD-10-CM | POA: Insufficient documentation

## 2018-03-08 ENCOUNTER — Ambulatory Visit: Payer: Medicare Other

## 2018-03-08 ENCOUNTER — Other Ambulatory Visit: Payer: Self-pay

## 2018-03-08 ENCOUNTER — Ambulatory Visit
Admission: EM | Admit: 2018-03-08 | Discharge: 2018-03-08 | Disposition: A | Discharge status: Home / Self Care | Payer: Medicare Other | Attending: Emergency Medicine | Admitting: Emergency Medicine

## 2018-03-08 ENCOUNTER — Encounter: Payer: Self-pay | Admitting: Emergency Medicine

## 2018-03-08 DIAGNOSIS — Z79891 Long term (current) use of opiate analgesic: Secondary | ICD-10-CM | POA: Diagnosis not present

## 2018-03-08 DIAGNOSIS — S2241XA Multiple fractures of ribs, right side, initial encounter for closed fracture: Secondary | ICD-10-CM

## 2018-03-08 DIAGNOSIS — W010XXA Fall on same level from slipping, tripping and stumbling without subsequent striking against object, initial encounter: Secondary | ICD-10-CM | POA: Insufficient documentation

## 2018-03-08 DIAGNOSIS — Z7984 Long term (current) use of oral hypoglycemic drugs: Secondary | ICD-10-CM | POA: Insufficient documentation

## 2018-03-08 DIAGNOSIS — Z7982 Long term (current) use of aspirin: Secondary | ICD-10-CM | POA: Insufficient documentation

## 2018-03-08 DIAGNOSIS — R0781 Pleurodynia: Secondary | ICD-10-CM | POA: Insufficient documentation

## 2018-03-08 DIAGNOSIS — Z79899 Other long term (current) drug therapy: Secondary | ICD-10-CM | POA: Insufficient documentation

## 2018-03-08 DIAGNOSIS — S2249XA Multiple fractures of ribs, unspecified side, initial encounter for closed fracture: Secondary | ICD-10-CM | POA: Diagnosis not present

## 2018-03-08 DIAGNOSIS — Z87891 Personal history of nicotine dependence: Secondary | ICD-10-CM | POA: Diagnosis not present

## 2018-03-08 DIAGNOSIS — W19XXXA Unspecified fall, initial encounter: Secondary | ICD-10-CM | POA: Diagnosis not present

## 2018-03-08 MED ORDER — TRAMADOL HCL 50 MG PO TABS: 50.0000 mg | ORAL_TABLET | Freq: Four times a day (QID) | ORAL | 0 refills | Status: AC | PRN

## 2018-03-08 NOTE — Discharge Instructions (Signed)
2 Aleve with 1 g of Tylenol twice a day.  You may take an additional gram of Tylenol 2 more times a day.  Do not take more than 4 g of Tylenol from all sources in 1 day.  Tramadol for severe pain only.  Be careful with this as this can increase your risk of falls.  Use the incentive spirometer several times an hour for at least the next week.  Follow-up with your primary care physician.  Go immediately to the ER for fevers above 100.4, if you start coughing up blood, pain not controlled with medication, shortness of breath, or for any other concerns.

## 2018-03-08 NOTE — ED Provider Notes (Signed)
HPI  SUBJECTIVE:  Alicia [name] is a 77 y.o. female who presents with right-sided posteriolateral rib pain described as sore, achy, constant after losing her balance and falling onto her right side yesterday.  States that she fell onto her arm a little up against her body.  She denies bruising.  She denies head injury, loss of consciousness, nausea, vomiting, chest pain, shortness of breath, hemoptysis.  No abdominal pain.  She is moving all extremities without any problem.  No neck or back pain.  She tried Aleve, heat, IcyHot.  Aleve helps.  Symptoms are worse with getting up and down.  She has a past medical history of osteopenia, obesity, diabetes, hypertension.  No history of kidney disease.  [id number], Meriel Flavors  Past Medical History:  Diagnosis Date  . Diabetes mellitus without complication (HCC)   . Elevated cholesterol   . Hypertension     Past Surgical History:  Procedure Laterality Date  . ANKLE FRACTURE SURGERY Right 2012  . CATARACT EXTRACTION W/ INTRAOCULAR LENS IMPLANT Left 2008  . DILATION AND CURETTAGE OF UTERUS N/A 1978  . FRACTURE SURGERY    . SHOULDER HEMI-ARTHROPLASTY Left 11/17/2014   Procedure: SHOULDER HEMI-ARTHROPLASTY;  Surgeon: Christena Flake, MD;  Location: [facility] ORS;  Service: Orthopedics;  Laterality: Left;  . TONSILLECTOMY  1950  . TUBAL LIGATION Bilateral 1978    Family History  Problem Relation Age of Onset  . Breast cancer Maternal Aunt     Social History   Tobacco Use  . Smoking status: Former Smoker    Packs/day: 0.25  . Smokeless tobacco: Never Used  Substance Use Topics  . Alcohol use: Yes    Comment: occasionally  . Drug use: No    No current facility-administered medications for this encounter.   Current Outpatient Medications:  .  amLODipine (NORVASC) 10 MG tablet, Take 10 mg by mouth every morning. , Disp: , Rfl:  .  aspirin 81 MG tablet, Take 81 mg by mouth every evening. , Disp: , Rfl:  .  atorvastatin (LIPITOR) 20 MG tablet,  Take 20 mg by mouth daily at 6 PM. , Disp: , Rfl:  .  BAYER CONTOUR TEST test strip, 1 strip by Other route as needed., Disp: , Rfl:  .  calcium carbonate (TUMS - DOSED IN MG ELEMENTAL CALCIUM) 500 MG chewable tablet, Chew 3 tablets by mouth 2 (two) times daily with a meal., Disp: , Rfl:  .  Calcium Citrate-Vitamin D (CALCIUM + D PO), Take 1 tablet by mouth every evening., Disp: , Rfl:  .  fluticasone (FLONASE) 50 MCG/ACT nasal spray, Place 2 sprays into both nostrils daily., Disp: , Rfl:  .  furosemide (LASIX) 20 MG tablet, Take 20 mg by mouth 3 (three) times a week. As needed for swelling, Disp: , Rfl:  .  hydrochlorothiazide (HYDRODIURIL) 25 MG tablet, Take 25 mg by mouth every morning. , Disp: , Rfl:  .  losartan (COZAAR) 50 MG tablet, Take 50 mg by mouth every morning. 1.5 tablets every am, Disp: , Rfl:  .  metFORMIN (GLUCOPHAGE) 500 MG tablet, Take by mouth 2 (two) times daily with a meal., Disp: , Rfl:  .  metoprolol succinate (TOPROL-XL) 100 MG 24 hr tablet, Take 100 mg by mouth every morning. Take with or immediately following a meal., Disp: , Rfl:  .  Omega-3 Fatty Acids ([employer] FISH OIL) 1000 MG CAPS, Take 1 capsule by mouth 2 (two) times daily. , Disp: , Rfl:  .  omeprazole (  PRILOSEC) 20 MG capsule, Take 20 mg by mouth every morning. , Disp: , Rfl:  .  ferrous sulfate 325 (65 FE) MG tablet, Take by mouth., Disp: , Rfl:  .  MICROLET LANCETS MISC, 1 Stick by Other route daily., Disp: , Rfl:  .  traMADol (ULTRAM) 50 MG tablet, Take 1 tablet (50 mg total) by mouth every 6 (six) hours as needed., Disp: 15 tablet, Rfl: 0  Allergies  Allergen Reactions  . Hydrocodone-Acetaminophen Other (See Comments)    Drowsiness     ROS  As noted in HPI.   Physical Exam  BP (!) 148/58 (BP Location: Left Arm)   Pulse 66   Temp 98.3 F (36.8 C) (Oral)   Resp 18   Ht 5\' 3"  (1.6 m)   Wt 113.4 kg   SpO2 95%   BMI 44.29 kg/m   Constitutional: Well developed, well nourished, no acute  distress Eyes:  EOMI, conjunctiva normal bilaterally HENT: Normocephalic, atraumatic,mucus membranes moist Respiratory: Normal inspiratory effort, lungs clear bilaterally, good air movement.  No bruising over the anterior posterior right chest.  Positive bony rib tenderness along the mid posterior ribs and in the axillary line along the latissimus.  No appreciable paradoxical chest wall motion.  No appreciable crepitus.  No other rib tenderness. Cardiovascular: Normal rate, regular rhythm, no murmurs, rubs, gallops. GI: nondistended skin: No rash, skin intact Musculoskeletal: no deformities moving all extremities.  Able to do full AROM of the right shoulder.  No C-spine, T-spine, L-spine tenderness. Neurologic: Alert & oriented x 3, no focal neuro deficits.  gait steady. Psychiatric: Speech and behavior appropriate   ED Course   Medications - No data to display  Orders Placed This Encounter  Procedures  . DG Ribs Unilateral W/Chest Right    Standing Status:   Standing    Number of Occurrences:   1    Order Specific Question:   Reason for Exam (SYMPTOM  OR DIAGNOSIS REQUIRED)    Answer:   Status post fall.  Positive posterior lower rib tenderness.  Rule out fracture, pneumothorax, effusion.    No results found for this or any previous visit (from the past 24 hour(s)). Dg Ribs Unilateral W/chest Right  Result Date: 03/08/2018 CLINICAL DATA:  Fall with lower rib tenderness EXAM: RIGHT RIBS AND CHEST - 3+ VIEW COMPARISON:  01/22/2011, CT chest 01/22/2011 FINDINGS: Left shoulder replacement. No acute airspace disease or effusion. Oval opacity in the right upper lung is felt to be secondary to bony prominence on comparison CT. Mild cardiomegaly with aortic atherosclerosis. No pneumothorax. Right rib series demonstrates acute mildly displaced right third through eighth rib fractures. IMPRESSION: 1. Negative for pneumothorax or pleural effusion. 2. Acute mildly displaced right third through  eighth rib fractures. 3. Cardiomegaly Electronically Signed   By: Jasmine Pang M.D.   On: 03/08/2018 14:16    ED Clinical Impression  Multiple rib fractures involving four or more ribs   ED Assessment/Plan  O2 Sat is at baseline. Given history of osteopenia and posterior right lower rib tenderness will check a rib series.  Reviewed imaging independently.  Mildly displaced third through eighth rib fractures.  See radiology report for full details.  [facility] narcotic database reviewed.  No opiate prescriptions in the past 2 years.  Patient with 6 rib fractures.  However she is in no respiratory distress, appears fairly comfortable.  She is satting well on room air.   will handwrite a prescription for an incentive spirometer.  Patient  to continue two Aleve twice a day, take 1 g of Tylenol with the Aleve.  Take an additional 1 g of Tylenol 2 more times a day.  Or she can take Aleve and tramadol.  Patient states that Norco makes her very drowsy and daughter states it makes patient delirious.  Discussed with patient that I am hesitant to prescribe oxycodone as I am afraid it would make her even more sedated.  She will be watched by her daughter for the next few days to make sure that the tramadol is safe for her to take and to watch for any pulmonary complications.  Had an extensive discussion with family and patient about being careful with the tramadol.  That it may increase her fall risk.  She will follow-up with her primary care physician next week.  Making a CD copy of the x-ray films for the patient.  Discussed imaging, MDM, treatment plan, and plan for follow-up with patient and family. Discussed sn/sx that should prompt return to the ED. patient and family agree with plan.   Meds ordered this encounter  Medications  . traMADol (ULTRAM) 50 MG tablet    Sig: Take 1 tablet (50 mg total) by mouth every 6 (six) hours as needed.    Dispense:  15 tablet    Refill:  0    *This clinic  note was created using Scientist, clinical (histocompatibility and immunogenetics)Dragon dictation software. Therefore, there may be occasional mistakes despite careful proofreading.   ?   Domenick GongMortenson, Shaterria Sager, MD 03/08/18 2048

## 2018-03-08 NOTE — ED Triage Notes (Signed)
Pt fell while getting off the train yesterday on concrete. She is having on her back on right side. Worse when she moves. No bruising or abrasions. No other injuries.

## 2018-03-20 ENCOUNTER — Other Ambulatory Visit: Payer: Self-pay | Admitting: Medical Oncology

## 2018-03-20 DIAGNOSIS — Z780 Asymptomatic menopausal state: Secondary | ICD-10-CM

## 2018-03-20 DIAGNOSIS — Z13820 Encounter for screening for osteoporosis: Secondary | ICD-10-CM

## 2018-04-24 ENCOUNTER — Ambulatory Visit
Admission: RE | Admit: 2018-04-24 | Discharge: 2018-04-24 | Disposition: A | Discharge status: Home / Self Care | Payer: Medicare Other | Source: Ambulatory Visit | Attending: Medical Oncology | Admitting: Medical Oncology

## 2018-04-24 DIAGNOSIS — Z13820 Encounter for screening for osteoporosis: Secondary | ICD-10-CM | POA: Insufficient documentation

## 2018-04-24 DIAGNOSIS — Z780 Asymptomatic menopausal state: Secondary | ICD-10-CM | POA: Diagnosis present

## 2018-10-25 ENCOUNTER — Other Ambulatory Visit: Payer: Self-pay | Admitting: Medical Oncology

## 2018-10-25 DIAGNOSIS — Z1231 Encounter for screening mammogram for malignant neoplasm of breast: Secondary | ICD-10-CM

## 2018-11-03 ENCOUNTER — Other Ambulatory Visit: Payer: Self-pay

## 2018-11-03 ENCOUNTER — Encounter: Payer: Self-pay | Admitting: Emergency Medicine

## 2018-11-03 ENCOUNTER — Ambulatory Visit
Admission: EM | Admit: 2018-11-03 | Discharge: 2018-11-03 | Disposition: A | Discharge status: Home / Self Care | Payer: Medicare Other | Attending: Family Medicine | Admitting: Family Medicine

## 2018-11-03 DIAGNOSIS — I10 Essential (primary) hypertension: Secondary | ICD-10-CM

## 2018-11-03 MED ORDER — CLONIDINE HCL 0.1 MG PO TABS: 0.1000 mg | ORAL_TABLET | Freq: Two times a day (BID) | ORAL | 0 refills | Status: AC

## 2018-11-03 NOTE — ED Triage Notes (Signed)
Patient states that her blood pressure has been elevated for the past 2 days when she checked it at home. Patient reports HAs.

## 2018-11-03 NOTE — Discharge Instructions (Signed)
Follow up with Primary Care provider this week

## 2018-11-03 NOTE — ED Triage Notes (Signed)
Patient states that her PCP took her off Amlodipine couple months ago.

## 2018-11-03 NOTE — ED Provider Notes (Signed)
[facility]    CSN: [id number] Arrival date & time: 11/03/18  1359     History   Chief Complaint Chief Complaint  Patient presents with  . Hypertension    HPI Alicia [name] is a 78 y.o. female.   78 yo female with a c/o "high blood pressure readings" at home for the past several days. States she's been having systolic blood pressure readings between 170s-190s. Denies any chest pain, shortness of breath, numbness/tingling, one-sided weakness, vision changes. States that her PCP discontinued her amlodipine several months ago "because my blood pressure or heart rate was low and I was feeling dizzy". Patient is on metoprolol, losartan, hctz.    Hypertension    Past Medical History:  Diagnosis Date  . Diabetes mellitus without complication (HCC)   . Elevated cholesterol   . Hypertension     Patient Active Problem List   Diagnosis Date Noted  . 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 ANKLE FRACTURE SURGERY Right 2012  . CATARACT EXTRACTION W/ INTRAOCULAR LENS IMPLANT Left 2008  . DILATION AND CURETTAGE OF UTERUS N/A 1978  . FRACTURE SURGERY    . SHOULDER HEMI-ARTHROPLASTY Left 11/17/2014   Procedure: SHOULDER HEMI-ARTHROPLASTY;  Surgeon: Christena FlakeJohn J Poggi, MD;  Location: [facility] ORS;  Service: Orthopedics;  Laterality: Left;  . TONSILLECTOMY  1950  . TUBAL LIGATION Bilateral 1978    OB History   No obstetric history on file.      Home Medications    Prior to Admission medications   Medication Sig Start Date End Date Taking? Authorizing Provider  aspirin 81 MG tablet Take 81 mg by mouth every evening.    Yes [provider]  atorvastatin (LIPITOR) 20 MG tablet Take 20 mg by mouth daily at 6 PM.    Yes [provider]  calcium carbonate (TUMS - DOSED IN MG ELEMENTAL CALCIUM) 500 MG chewable tablet Chew 3 tablets by mouth 2 (two) times daily with a meal.   Yes [provider]  Calcium Citrate-Vitamin D (CALCIUM + D PO) Take 1 tablet by mouth every evening.   Yes [provider]  ferrous sulfate 325 (65 FE) MG tablet Take by mouth.   Yes [provider]  fluticasone (FLONASE) 50 MCG/ACT nasal spray Place 2 sprays into both nostrils daily. 11/03/14  Yes [provider]  furosemide (LASIX) 20 MG tablet Take 20 mg by mouth 3 (three) times a week. As needed for swelling   Yes [provider]  hydrochlorothiazide (HYDRODIURIL) 25 MG tablet Take 25 mg by mouth every morning.    Yes [provider]  losartan (COZAAR) 50 MG tablet Take 50 mg by mouth every morning. 1.5 tablets every am   Yes [provider]  metFORMIN (GLUCOPHAGE) 500 MG tablet Take by mouth 2 (two) times daily with a meal.   Yes [provider]  metoprolol succinate (TOPROL-XL) 100 MG 24 hr tablet Take 100 mg by mouth every morning. Take with or immediately following a meal.   Yes [provider]  Omega-3 Fatty Acids ([employer] FISH OIL) 1000 MG CAPS Take 1 capsule by mouth 2 (two) times daily.    Yes [provider]  omeprazole (PRILOSEC) 20 MG capsule Take 20 mg by mouth every morning.    Yes [provider]  amLODipine (NORVASC) 10 MG tablet Take 10 mg by mouth every morning.     [provider]  BAYER CONTOUR TEST test strip 1 strip by Other route as needed. 08/23/14   [provider]  cloNIDine (CATAPRES) 0.1 MG tablet Take 1 tablet (0.1 mg total) by mouth 2 (two) times daily. 11/03/18   Payton Mccallumonty, Briar Witherspoon, MD  MICROLET LANCETS MISC 1 Stick by Other route daily. 08/23/14   [provider]  traMADol (ULTRAM) 50 MG tablet Take 1 tablet (50 mg total) by mouth every 6 (six) hours as needed. 03/08/18   Domenick GongMortenson, Ashley, MD    Family History Family History  Problem Relation Age of Onset  . Breast cancer Maternal Aunt     Social History Social History   Tobacco Use  . Smoking status:  Former Smoker    Packs/day: 0.25  . Smokeless tobacco: Never Used  Substance Use Topics  . Alcohol use: Yes    Comment: occasionally  . Drug use: No     Allergies   Hydrocodone-acetaminophen   Review of Systems Review of Systems   Physical Exam Triage Vital Signs ED Triage Vitals  Enc Vitals Group     BP 11/03/18 1414 (!) 174/88     Pulse Rate 11/03/18 1414 60     Resp 11/03/18 1414 18     Temp 11/03/18 1414 97.7 F (36.5 C)     Temp Source 11/03/18 1414 Oral     SpO2 11/03/18 1414 98 %     Weight 11/03/18 1410 250 lb (113.4 kg)     Height 11/03/18 1410 5\' 2"  (1.575 m)     Head Circumference --      Peak Flow --      Pain Score 11/03/18 1409 8     Pain Loc --      Pain Edu? --      Excl. in GC? --    No data found.  Updated Vital Signs BP (!) 174/88 (BP Location: Right Arm)   Pulse 60   Temp 97.7 F (36.5 C) (Oral)   Resp 18   Ht 5\' 2"  (1.575 m)   Wt 113.4 kg   SpO2 98%   BMI 45.73 kg/m   Visual Acuity Right Eye Distance:   Left Eye Distance:   Bilateral Distance:    Right Eye Near:   Left Eye Near:    Bilateral Near:     Physical Exam Vitals signs and nursing note reviewed.  Constitutional:      General: She is not in acute distress.    Appearance: She is not ill-appearing, toxic-appearing or diaphoretic.  Cardiovascular:     Rate and Rhythm: Normal rate.     Heart sounds: Normal heart sounds.  Pulmonary:     Effort: Pulmonary effort is normal. No respiratory distress.     Breath sounds: Normal breath sounds.  Neurological:     General: No focal deficit present.     Mental Status: She is alert.      UC Treatments / Results  Labs (all labs ordered are listed, but only abnormal results are displayed) Labs Reviewed - No data to display  EKG   Radiology No results found.  Procedures Procedures (including critical care time)  Medications Ordered in UC Medications - No data to display  Initial Impression / Assessment and Plan  / UC Course  I have reviewed the triage vital signs and the nursing notes.  Pertinent labs & imaging results that were available during my care of the patient were reviewed by me and considered in my medical decision making (  see chart for details).      Final Clinical Impressions(s) / UC Diagnoses   Final diagnoses:  Essential hypertension     Discharge Instructions     Follow up with Primary Care provider this week    ED Prescriptions    Medication Sig Dispense Auth. Provider   cloNIDine (CATAPRES) 0.1 MG tablet Take 1 tablet (0.1 mg total) by mouth 2 (two) times daily. 20 tablet Norval Gable, MD      1. diagnosis reviewed with patient 2. rx as per orders above; reviewed possible side effects, interactions, risks and benefits  3. Continue other current medications 4. Follow up with PCP this week 5. Follow-up prn   Controlled Substance Prescriptions [place] Controlled Substance Registry consulted? Not Applicable   Norval Gable, MD 11/03/18 1450

## 2018-11-04 ENCOUNTER — Encounter: Payer: Self-pay | Admitting: Emergency Medicine

## 2018-11-04 ENCOUNTER — Other Ambulatory Visit: Payer: Self-pay

## 2018-11-04 ENCOUNTER — Ambulatory Visit
Admission: EM | Admit: 2018-11-04 | Discharge: 2018-11-04 | Disposition: A | Discharge status: Home / Self Care | Payer: Medicare Other | Attending: Emergency Medicine | Admitting: Emergency Medicine

## 2018-11-04 DIAGNOSIS — R51 Headache: Secondary | ICD-10-CM | POA: Diagnosis present

## 2018-11-04 DIAGNOSIS — T7840XS Allergy, unspecified, sequela: Secondary | ICD-10-CM | POA: Insufficient documentation

## 2018-11-04 DIAGNOSIS — I10 Essential (primary) hypertension: Secondary | ICD-10-CM | POA: Diagnosis not present

## 2018-11-04 DIAGNOSIS — T7840XA Allergy, unspecified, initial encounter: Secondary | ICD-10-CM

## 2018-11-04 DIAGNOSIS — R519 Headache, unspecified: Secondary | ICD-10-CM

## 2018-11-04 NOTE — ED Provider Notes (Signed)
[facility]    CSN: [id number] Arrival date & time: 11/04/18  1045      History   Chief Complaint Chief Complaint  Patient presents with   Follow-up    HPI Alicia [name] is a 78 y.o. female presenting with concerns of elevated BP. Pt was seen in this facility less than 24 hours ago with same complaint. Previous provider started her on clonidine; pt took the medication last night and this AM and doesn't "feel better". Pt uses a wrist BP cuff and states that her at home readings are continuously in the 170/90s. Pt reports headache and numbness to left arm and leg. Denies SOB, chest pain, nausea.   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      Home Medications    Prior to Admission medications   Medication Sig Start Date End Date Taking? Authorizing Provider  aspirin 81 MG tablet Take 81 mg by mouth every evening.    Yes [provider]  atorvastatin (LIPITOR) 20 MG tablet Take 20 mg by mouth daily at 6 PM.    Yes [provider]  BAYER CONTOUR TEST test strip 1 strip by Other route as needed. 08/23/14  Yes [provider]  calcium carbonate (TUMS - DOSED IN MG ELEMENTAL CALCIUM) 500 MG chewable tablet Chew 3 tablets by mouth 2 (two) times  daily with a meal.   Yes [provider]  Calcium Citrate-Vitamin D (CALCIUM + D PO) Take 1 tablet by mouth every evening.   Yes [provider]  cloNIDine (CATAPRES) 0.1 MG tablet Take 1 tablet (0.1 mg total) by mouth 2 (two) times daily. 11/03/18  Yes Payton Mccallumonty, [place], MD  ferrous sulfate 325 (65 FE) MG tablet Take by mouth.   Yes [provider]  fluticasone (FLONASE) 50 MCG/ACT nasal spray Place 2 sprays into both nostrils daily. 11/03/14  Yes [provider]  furosemide (LASIX) 20 MG tablet Take 20 mg by mouth 3 (three) times a week. As needed for swelling   Yes [provider]  hydrochlorothiazide (HYDRODIURIL) 25 MG tablet Take 25 mg by mouth every morning.    Yes [provider]  losartan (COZAAR) 50 MG tablet Take 50 mg by mouth every morning. 1.5 tablets every am   Yes [provider]  metFORMIN (GLUCOPHAGE) 500 MG tablet Take by mouth 2 (two) times daily with a meal.   Yes [provider]  metoprolol succinate (TOPROL-XL) 100 MG 24 hr tablet Take 100 mg by mouth every morning. Take with or immediately following a meal.   Yes [provider]  MICROLET LANCETS MISC 1 Stick by Other route daily. 08/23/14  Yes [provider]  Omega-3 Fatty Acids ([employer] FISH OIL) 1000 MG  CAPS Take 1 capsule by mouth 2 (two) times daily.    Yes [provider]  omeprazole (PRILOSEC) 20 MG capsule Take 20 mg by mouth every morning.    Yes [provider]  traMADol (ULTRAM) 50 MG tablet Take 1 tablet (50 mg total) by mouth every 6 (six) hours as needed. 03/08/18  Yes Melynda Ripple, MD  amLODipine (NORVASC) 10 MG tablet Take 10 mg by mouth every morning.     [provider]    Family History Family History  Problem Relation Age of Onset   Breast cancer Maternal Aunt     Social History Social History   Tobacco Use   Smoking status: Former Smoker    Packs/day: 0.25   Smokeless tobacco:  Never Used  Substance Use Topics   Alcohol use: Yes    Comment: occasionally   Drug use: No     Allergies   Hydrocodone-acetaminophen   Review of Systems Review of Systems  Constitutional: Negative for chills, fatigue and fever.  Respiratory: Negative for cough, shortness of breath and wheezing.   Cardiovascular: Negative for chest pain.  Gastrointestinal: Negative for nausea.  Skin: Negative for color change and rash.  Neurological: Positive for numbness and headaches.     Physical Exam Triage Vital Signs ED Triage Vitals  Enc Vitals Group     BP 11/04/18 1118 (!) 158/78     Pulse Rate 11/04/18 1118 (!) 58     Resp 11/04/18 1118 20     Temp 11/04/18 1118 98.3 F (36.8 C)     Temp Source 11/04/18 1118 Oral     SpO2 11/04/18 1118 97 %     Weight 11/04/18 1116 250 lb (113.4 kg)     Height 11/04/18 1116 5\' 2"  (1.575 m)     Head Circumference --      Peak Flow --      Pain Score 11/04/18 1115 9     Pain Loc --      Pain Edu? --      Excl. in [place]? --    No data found.  Updated Vital Signs BP (!) 158/78 (BP Location: Right Arm)    Pulse (!) 58    Temp 98.3 F (36.8 C) (Oral)    Resp 20    Ht 5\' 2"  (1.575 m)    Wt 250 lb (113.4 kg)    SpO2 97%    BMI 45.73 kg/m   Visual Acuity Right Eye Distance:   Left Eye Distance:   Bilateral Distance:    Right Eye Near:   Left Eye Near:    Bilateral Near:     Physical Exam Vitals signs and nursing note reviewed.  Constitutional:      General: She is not in acute distress.    Appearance: She is well-developed.  HENT:     Head: Normocephalic and atraumatic.     Right Ear: Tympanic membrane is retracted.     Left Ear: A middle ear effusion is present. Tympanic membrane is retracted.  Eyes:     Conjunctiva/sclera: Conjunctivae normal.  Neck:     Musculoskeletal: Neck supple.  Cardiovascular:     Rate and Rhythm: Normal rate and regular rhythm.     Heart sounds: No murmur.  Pulmonary:     Effort: Pulmonary effort  is normal. No respiratory distress.     Breath sounds: Normal breath sounds.  Abdominal:     Palpations: Abdomen is soft.     Tenderness: There  is no abdominal tenderness.  Skin:    General: Skin is warm and dry.  Neurological:     Mental Status: She is alert.      UC Treatments / Results  Labs (all labs ordered are listed, but only abnormal results are displayed) Labs Reviewed - No data to display  EKG   Radiology No results found.  Procedures ED EKG  Date/Time: 11/04/2018 2:56 PM Performed by: Bailey MechBenjamin, Chrislyn Seedorf, NP Authorized by: Tommie Samsook, Jayce G, DO   ECG reviewed by ED Physician in the absence of a cardiologist: yes   Interpretation:    Interpretation: normal   Rhythm:    Rhythm: sinus rhythm     (including critical care time)  Medications Ordered in UC Medications - No data to display  Initial Impression / Assessment and Plan / UC Course  I have reviewed the triage vital signs and the nursing notes.  Pertinent labs & imaging results that were available during my care of the patient were reviewed by me and considered in my medical decision making (see chart for details).     Pt presents with concerns regarding her blood pressure and headache, diagnosed with allergies and essential hypertension and treated as such. Pt instructed to follow directions below and go to ER is symptoms persist/worsen. All questions answered and all concerns addressed.   Final Clinical Impressions(s) / UC Diagnoses   Final diagnoses:  Sinus headache  Essential hypertension  Allergic state, sequela     Discharge Instructions     Use allergy medication (Allegra and Flonase) every day.   Take other medications as prescribed.   Follow-up with PCP as scheduled tomorrow.     ED Prescriptions    None     Controlled Substance Prescriptions  Controlled Substance Registry consulted? Not Applicable    Bailey MechLunise Zoei Amison, DNP, NP-c    Bailey MechBenjamin, Analeigha Nauman, NP 11/04/18 [phone]

## 2018-11-04 NOTE — Discharge Instructions (Signed)
Use allergy medication (Allegra and Flonase) every day.   Take other medications as prescribed.   Follow-up with PCP as scheduled tomorrow.

## 2018-11-04 NOTE — ED Triage Notes (Signed)
Patient states she is here for f/u from visit yesterday for elevated blood pressure. Patient states she took 1 Clonidine last night and 1 this morning and it isn't helping her so she wanted to be re evaluated.

## 2018-11-11 ENCOUNTER — Ambulatory Visit
Admission: RE | Admit: 2018-11-11 | Discharge: 2018-11-11 | Disposition: A | Discharge status: Home / Self Care | Payer: Medicare Other | Source: Ambulatory Visit | Attending: Medical Oncology | Admitting: Medical Oncology

## 2018-11-11 ENCOUNTER — Other Ambulatory Visit: Payer: Self-pay

## 2018-11-11 DIAGNOSIS — Z1231 Encounter for screening mammogram for malignant neoplasm of breast: Secondary | ICD-10-CM | POA: Insufficient documentation

## 2018-12-03 ENCOUNTER — Ambulatory Visit
Admission: RE | Admit: 2018-12-03 | Discharge: 2018-12-03 | Disposition: A | Discharge status: Home / Self Care | Payer: Medicare Other | Source: Ambulatory Visit | Attending: Family Medicine | Admitting: Family Medicine

## 2018-12-03 ENCOUNTER — Other Ambulatory Visit: Payer: Self-pay | Admitting: Family Medicine

## 2018-12-03 ENCOUNTER — Other Ambulatory Visit: Payer: Self-pay

## 2018-12-03 ENCOUNTER — Ambulatory Visit
Admission: RE | Admit: 2018-12-03 | Discharge: 2018-12-03 | Disposition: A | Discharge status: Home / Self Care | Payer: Medicare Other | Attending: Family Medicine | Admitting: Family Medicine

## 2018-12-03 DIAGNOSIS — M79605 Pain in left leg: Secondary | ICD-10-CM | POA: Diagnosis present

## 2018-12-03 DIAGNOSIS — M545 Low back pain, unspecified: Secondary | ICD-10-CM

## 2018-12-08 ENCOUNTER — Other Ambulatory Visit: Payer: Self-pay

## 2018-12-08 DIAGNOSIS — T380X5A Adverse effect of glucocorticoids and synthetic analogues, initial encounter: Secondary | ICD-10-CM | POA: Insufficient documentation

## 2018-12-08 DIAGNOSIS — I10 Essential (primary) hypertension: Secondary | ICD-10-CM | POA: Insufficient documentation

## 2018-12-08 DIAGNOSIS — Z7982 Long term (current) use of aspirin: Secondary | ICD-10-CM | POA: Insufficient documentation

## 2018-12-08 DIAGNOSIS — Z87891 Personal history of nicotine dependence: Secondary | ICD-10-CM | POA: Diagnosis not present

## 2018-12-08 DIAGNOSIS — Z79899 Other long term (current) drug therapy: Secondary | ICD-10-CM | POA: Insufficient documentation

## 2018-12-08 DIAGNOSIS — E119 Type 2 diabetes mellitus without complications: Secondary | ICD-10-CM | POA: Diagnosis not present

## 2018-12-08 LAB — CBC WITH DIFFERENTIAL/PLATELET
Abs Immature Granulocytes: 0.05 10*3/uL (ref 0.00–0.07)
Basophils Absolute: 0 10*3/uL (ref 0.0–0.1)
Basophils Relative: 0 %
Eosinophils Absolute: 0.1 10*3/uL (ref 0.0–0.5)
Eosinophils Relative: 1 %
HCT: 43.3 % (ref 36.0–46.0)
Hemoglobin: 14.5 g/dL (ref 12.0–15.0)
Immature Granulocytes: 1 %
Lymphocytes Relative: 29 %
Lymphs Abs: 2.7 10*3/uL (ref 0.7–4.0)
MCH: 28.2 pg (ref 26.0–34.0)
MCHC: 33.5 g/dL (ref 30.0–36.0)
MCV: 84.2 fL (ref 80.0–100.0)
Monocytes Absolute: 0.8 10*3/uL (ref 0.1–1.0)
Monocytes Relative: 9 %
Neutro Abs: 5.6 10*3/uL (ref 1.7–7.7)
Neutrophils Relative %: 60 %
Platelets: 247 10*3/uL (ref 150–400)
RBC: 5.14 MIL/uL — ABNORMAL HIGH (ref 3.87–5.11)
RDW: 12.9 % (ref 11.5–15.5)
WBC: 9.2 10*3/uL (ref 4.0–10.5)
nRBC: 0 % (ref 0.0–0.2)

## 2018-12-08 LAB — COMPREHENSIVE METABOLIC PANEL
ALT: 23 U/L (ref 0–44)
AST: 26 U/L (ref 15–41)
Albumin: 4.3 g/dL (ref 3.5–5.0)
Alkaline Phosphatase: 53 U/L (ref 38–126)
Anion gap: 13 (ref 5–15)
BUN: 25 mg/dL — ABNORMAL HIGH (ref 8–23)
CO2: 28 mmol/L (ref 22–32)
Calcium: 9.6 mg/dL (ref 8.9–10.3)
Chloride: 87 mmol/L — ABNORMAL LOW (ref 98–111)
Creatinine, Ser: 0.69 mg/dL (ref 0.44–1.00)
GFR calc Af Amer: >60 mL/min (ref >60)
GFR calc non Af Amer: >60 mL/min (ref >60)
Glucose, Bld: 103 mg/dL — ABNORMAL HIGH (ref 70–99)
Potassium: 3.2 mmol/L — ABNORMAL LOW (ref 3.5–5.1)
Sodium: 128 mmol/L — ABNORMAL LOW (ref 135–145)
Total Bilirubin: 0.9 mg/dL (ref 0.3–1.2)
Total Protein: 7.2 g/dL (ref 6.5–8.1)

## 2018-12-08 NOTE — ED Notes (Signed)
Patient given update on wait time. Patient verbalizes understanding.  

## 2018-12-08 NOTE — ED Triage Notes (Signed)
Patient reports last week her muscles got "inflammed" and her doctor put her on Trazodone and muscle relaxer but told her to be careful as it could raise her blood pressure.  Patient reports having a headache.

## 2018-12-08 NOTE — ED Notes (Signed)
Patient assisted to the bathroom 

## 2018-12-09 ENCOUNTER — Emergency Department
Admission: EM | Admit: 2018-12-09 | Discharge: 2018-12-09 | Disposition: A | Discharge status: Home / Self Care | Payer: Medicare Other | Attending: Emergency Medicine | Admitting: Emergency Medicine

## 2018-12-09 DIAGNOSIS — I10 Essential (primary) hypertension: Secondary | ICD-10-CM

## 2018-12-09 DIAGNOSIS — T380X5A Adverse effect of glucocorticoids and synthetic analogues, initial encounter: Secondary | ICD-10-CM

## 2018-12-09 NOTE — ED Notes (Signed)
Pt states was recently given prednisone for back pain. Pt states she has developed hypertension over last couple of days. Pt states she has a headache.

## 2018-12-09 NOTE — ED Provider Notes (Signed)
[facility] Emergency Department Provider Note  ____________________________________________  Time seen: Approximately 1:08 AM  I have reviewed the triage vital signs and the nursing notes.   HISTORY  Chief Complaint Hypertension   HPI Alicia [name] is a 78 y.o. female with a history of hypertension, hyperlipidemia, diabetes who presents for evaluation of elevated blood pressure.  Patient was started 2 days ago on prednisone for back pain.  She was told by her primary care doctor that it could cause elevated blood pressure.  She has been checking blood pressure at home.  Today her systolics were in the 185U which made her concerned and prompted her visit to the emergency room.  Endorses compliance with all her antihypertensive medications.  She is on losartan, metoprolol, and hydrochlorothiazide.  She reports having a mild diffuse throbbing headache earlier today however that has resolved. No CP, no shortness of breath, no dizziness, no facial droop, no slurred speech, no vertigo, no unilateral weakness or numbness.   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 Authorizing Provider  amLODipine (NORVASC) 10 MG tablet Take 10 mg by  mouth every morning.     [provider]  aspirin 81 MG tablet Take 81 mg by mouth every evening.     [provider]  atorvastatin (LIPITOR) 20 MG tablet Take 20 mg by mouth daily at 6 PM.     [provider]  BAYER CONTOUR TEST test strip 1 strip by Other route as needed. 08/23/14   [provider]  calcium carbonate (TUMS - DOSED IN MG ELEMENTAL CALCIUM) 500 MG chewable tablet Chew 3 tablets by mouth 2 (two) times daily with a meal.    [provider]  Calcium Citrate-Vitamin D (CALCIUM + D PO) Take 1 tablet by mouth every evening.    [provider]  cloNIDine (CATAPRES) 0.1 MG tablet Take 1 tablet (0.1 mg total) by mouth 2 (two) times daily. 11/03/18   Norval Gable, MD  ferrous sulfate 325 (65 FE) MG tablet Take by mouth.    [provider]  fluticasone (FLONASE) 50 MCG/ACT nasal spray Place 2 sprays into both nostrils daily. 11/03/14   [provider]  furosemide (LASIX) 20 MG tablet Take 20 mg by mouth 3 (three) times a week. As needed for swelling    [provider]  hydrochlorothiazide (HYDRODIURIL) 25 MG tablet Take 25 mg by mouth every morning.     [provider]  losartan (COZAAR) 50 MG tablet Take 50 mg by mouth every morning. 1.5 tablets every am    [provider]  metFORMIN (GLUCOPHAGE) 500 MG tablet Take by mouth 2 (two) times daily with a meal.    [provider]  metoprolol succinate (TOPROL-XL) 100 MG 24 hr tablet Take 100 mg by mouth every morning. Take with or immediately following a meal.    [provider]  MICROLET LANCETS MISC 1 Stick by Other route daily. 08/23/14   [provider]  Omega-3 Fatty Acids ([employer] FISH OIL) 1000 MG CAPS Take 1 capsule by mouth 2 (two) times daily.     [provider]  omeprazole (PRILOSEC) 20 MG capsule Take 20 mg by mouth every morning.     [provider]  traMADol (ULTRAM) 50 MG tablet Take 1 tablet (50  mg total) by mouth every 6 (six) hours as needed. 03/08/18   Domenick GongMortenson, Ashley, MD    Allergies Hydrocodone-acetaminophen  Family History  Problem Relation Age of Onset   Breast cancer Maternal Aunt     Social History Social History   Tobacco Use   Smoking status: Former Smoker    Packs/day: 0.25   Smokeless tobacco: Never Used  Substance Use Topics   Alcohol use: Yes    Comment: occasionally   Drug use: No    Review of Systems  Constitutional: Negative for fever. Eyes: Negative for visual changes. ENT: Negative for sore throat. Neck: No neck pain  Cardiovascular: Negative for chest pain. Respiratory: Negative for shortness of breath. Gastrointestinal: Negative for abdominal pain, vomiting or diarrhea. Genitourinary: Negative for dysuria. Musculoskeletal: Negative for back pain. Skin: Negative for rash. Neurological: Negative for weakness or numbness. + HA Psych: No SI or HI  ____________________________________________   PHYSICAL EXAM:  VITAL SIGNS: ED Triage Vitals  Enc Vitals Group     BP 12/08/18 1925 (!) 220/62     Pulse Rate 12/08/18 1925 71     Resp 12/08/18 1925 20     Temp 12/08/18 1925 98 F (36.7 C)     Temp Source 12/08/18 1925 Oral     SpO2 12/08/18 1925 96 %     Weight 12/08/18 1926 252 lb (114.3 kg)     Height 12/08/18 1926 5' (1.524 m)     Head Circumference --      Peak Flow --      Pain Score 12/08/18 1925 8     Pain Loc --      Pain Edu? --      Excl. in [place]? --     Constitutional: Alert and oriented. Well appearing and in no apparent distress. HEENT:      Head: Normocephalic and atraumatic.         Eyes: Conjunctivae are normal. Sclera is non-icteric.       Mouth/Throat: Mucous membranes are moist.       Neck: Supple with no signs of meningismus. Cardiovascular: Regular rate and rhythm. No murmurs, gallops, or rubs. 2+ symmetrical distal pulses are present in all extremities. No JVD. Respiratory: Normal respiratory effort.  Lungs are clear to auscultation bilaterally. No wheezes, crackles, or rhonchi.  Gastrointestinal: Soft, non tender, and non distended with positive bowel sounds. No rebound or guarding. Musculoskeletal: Nontender with normal range of motion in all extremities. No edema, cyanosis, or erythema of extremities. Neurologic: Normal speech and language. Face is symmetric.  Intact strength and sensation x4, no pronator drift, no dysmetria, extraocular movements are intact, pupils are equal round reactive. Skin: Skin is warm, dry and intact. No rash noted. Psychiatric: Mood and affect are normal. Speech and behavior are normal.  ____________________________________________   LABS (all labs ordered are listed, but only abnormal results are displayed)  Labs Reviewed  CBC WITH DIFFERENTIAL/PLATELET - Abnormal; Notable for the following components:      Result Value   RBC 5.14 (*)    All other components within normal limits  COMPREHENSIVE METABOLIC PANEL - Abnormal; Notable for the following components:   Sodium 128 (*)    Potassium 3.2 (*)    Chloride 87 (*)    Glucose, Bld 103 (*)    BUN 25 (*)    All other components within normal limits   ____________________________________________  EKG  ED ECG REPORT I, Nita Sickle, the attending physician, personally viewed and interpreted this ECG.  Normal sinus rhythm, rate of 64, normal intervals, normal axis, no ST elevations or depressions.  Normal EKG. ____________________________________________  RADIOLOGY  none ____________________________________________   PROCEDURES  Procedure(s) performed: None Procedures Critical Care performed:  None ____________________________________________   INITIAL IMPRESSION / ASSESSMENT AND PLAN / ED COURSE   78 y.o. female with a history of hypertension, hyperlipidemia, diabetes who presents for evaluation of elevated blood pressure in the setting of being placed on prednisone 2 days ago for  back pain. BP has normalized here with no intervention and is currently 166/77. Labs showing no end organ damage. EKG showing no ischemia. Patient had a mild HA earlier today which has resolved. No thunderclap HA no neuro deficits. Recommended stopping prednisone, continuing other home meds, checking BP BID for the next 48 hours and close f/u with PCP. Discussed my standard return precautions.        As part of my medical decision making, I reviewed the following data within the electronic MEDICAL RECORD NUMBER Nursing notes reviewed and incorporated, Labs reviewed , EKG interpreted , Old chart reviewed, Notes from prior ED visits and [place] Controlled Substance Database   Patient was evaluated in Emergency Department today for the symptoms described in the history of present illness. Patient was evaluated in the context of the global COVID-19 pandemic, which necessitated consideration that the patient might be at risk for infection with the SARS-CoV-2 virus that causes COVID-19. Institutional protocols and algorithms that pertain to the evaluation of patients at risk for COVID-19 are in a state of rapid change based on information released by regulatory bodies including the CDC and federal and state organizations. These policies and algorithms were followed during the patient's care in the ED.   ____________________________________________   FINAL CLINICAL IMPRESSION(S) / ED DIAGNOSES   Final diagnoses:  Hypertension, unspecified type  Adverse effect of corticosteroids, initial encounter      NEW MEDICATIONS STARTED DURING THIS VISIT:  ED Discharge Orders    None       Note:  This document was prepared using Dragon voice recognition software and may include unintentional dictation errors.    Nita Sickle, MD 12/09/18 [phone]

## 2018-12-09 NOTE — Discharge Instructions (Signed)
Stop prednisone. Continue your home medications otherwise. Follow up with your doctor in 2 days. Continue to check your blood pressure at home at least twice a day for the next 48 hours. Return to the ER if her blood pressure is above 200, if you have headache, chest pain, dizziness, shortness of breath.

## 2018-12-14 ENCOUNTER — Ambulatory Visit (INDEPENDENT_AMBULATORY_CARE_PROVIDER_SITE_OTHER)
Admission: EM | Admit: 2018-12-14 | Discharge: 2018-12-14 | Disposition: A | Discharge status: Home / Self Care | Payer: Medicare Other | Source: Home / Self Care | Attending: Family Medicine | Admitting: Family Medicine

## 2018-12-14 ENCOUNTER — Encounter: Payer: Self-pay | Admitting: Emergency Medicine

## 2018-12-14 ENCOUNTER — Other Ambulatory Visit: Payer: Self-pay

## 2018-12-14 DIAGNOSIS — E119 Type 2 diabetes mellitus without complications: Secondary | ICD-10-CM | POA: Diagnosis not present

## 2018-12-14 DIAGNOSIS — Z7982 Long term (current) use of aspirin: Secondary | ICD-10-CM | POA: Insufficient documentation

## 2018-12-14 DIAGNOSIS — Z79899 Other long term (current) drug therapy: Secondary | ICD-10-CM | POA: Diagnosis not present

## 2018-12-14 DIAGNOSIS — I10 Essential (primary) hypertension: Secondary | ICD-10-CM | POA: Insufficient documentation

## 2018-12-14 DIAGNOSIS — Z87891 Personal history of nicotine dependence: Secondary | ICD-10-CM | POA: Insufficient documentation

## 2018-12-14 DIAGNOSIS — R51 Headache: Secondary | ICD-10-CM | POA: Insufficient documentation

## 2018-12-14 DIAGNOSIS — Z7984 Long term (current) use of oral hypoglycemic drugs: Secondary | ICD-10-CM | POA: Diagnosis not present

## 2018-12-14 LAB — BASIC METABOLIC PANEL
Anion gap: 11 (ref 5–15)
BUN: 18 mg/dL (ref 8–23)
CO2: 28 mmol/L (ref 22–32)
Calcium: 9.3 mg/dL (ref 8.9–10.3)
Chloride: 90 mmol/L — ABNORMAL LOW (ref 98–111)
Creatinine, Ser: 0.81 mg/dL (ref 0.44–1.00)
GFR calc Af Amer: >60 mL/min (ref >60)
GFR calc non Af Amer: >60 mL/min (ref >60)
Glucose, Bld: 121 mg/dL — ABNORMAL HIGH (ref 70–99)
Potassium: 3.3 mmol/L — ABNORMAL LOW (ref 3.5–5.1)
Sodium: 129 mmol/L — ABNORMAL LOW (ref 135–145)

## 2018-12-14 MED ORDER — CLONIDINE HCL 0.1 MG PO TABS
0.1000 mg | ORAL_TABLET | Freq: Once | ORAL | Status: AC
  Administered 2018-12-14: 0.1 mg via ORAL

## 2018-12-14 NOTE — ED Triage Notes (Addendum)
Pt says she was seen at [facility] today for hypertension; says she had blood work and EKG performed, cleared and discharged; pt says her blood pressure is still elevated at home; says just the other day her primary increased her Losartan to 100mg  but she just picked that up today; has taken one pill today; pt arrives awake and alert, talking in complete coherent sentences; reports only a headache at this time-frontal and top of her head;

## 2018-12-14 NOTE — ED Provider Notes (Signed)
[facility]    CSN: [id number] Arrival date & time: 12/14/18  1559      History   Chief Complaint Chief Complaint  Patient presents with  . Hypertension    HPI Alicia [name] is a 78 y.o. female.   78 yo female with a c/o elevated blood pressure for the past 2-3 days. Patient denies any chest pains, shortness of breath, vision changes, numbness/tingling, slurred speech, severe headache, swallowing problems,or unilateral weakness. States she hasn't had any symptoms but has been checking her blood pressures at home and today were higher.    Hypertension    Past Medical History:  Diagnosis Date  . Diabetes mellitus without complication ([place])   . Elevated cholesterol   . Hypertension     Patient Active Problem List   Diagnosis Date Noted  . 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 ANKLE FRACTURE SURGERY Right 2012  . CATARACT EXTRACTION W/ INTRAOCULAR LENS IMPLANT Left 2008  . DILATION AND CURETTAGE OF UTERUS N/A 1978  . FRACTURE SURGERY    . SHOULDER HEMI-ARTHROPLASTY Left 11/17/2014   Procedure: SHOULDER HEMI-ARTHROPLASTY;  Surgeon: Corky Mull, MD;  Location: [facility] ORS;  Service: Orthopedics;  Laterality: Left;  . TONSILLECTOMY  1950  . TUBAL LIGATION Bilateral 1978    OB History   No obstetric history on file.      Home Medications    Prior to Admission medications   Medication Sig Start Date End Date Taking? Authorizing Provider  aspirin 81 MG tablet Take 81 mg by mouth every evening.    Yes [provider]  atorvastatin (LIPITOR) 20 MG tablet Take 20 mg by mouth daily at 6 PM.    Yes [provider]  Calcium Citrate-Vitamin D (CALCIUM + D PO) Take 1 tablet by mouth every evening.   Yes [provider]  ferrous sulfate 325 (65 FE) MG tablet Take by mouth.   Yes [provider]  fluticasone (FLONASE) 50 MCG/ACT nasal spray Place 2 sprays  into both nostrils daily. 11/03/14  Yes [provider]  furosemide (LASIX) 20 MG tablet Take 20 mg by mouth 3 (three) times a week. As needed for swelling   Yes [provider]  hydrochlorothiazide (HYDRODIURIL) 25 MG tablet Take 25 mg by mouth every morning.    Yes [provider]  losartan (COZAAR) 50 MG tablet Take 50 mg by mouth every morning. 1.5 tablets every am   Yes [provider]  metFORMIN (GLUCOPHAGE) 500 MG tablet Take by mouth 2 (two) times daily with a meal.   Yes [provider]  metoprolol succinate (TOPROL-XL) 100 MG 24 hr tablet Take 100 mg by mouth every morning. Take with or immediately following a meal.   Yes [provider]  Omega-3 Fatty Acids ([employer] FISH OIL) 1000 MG CAPS Take 1 capsule by mouth 2 (two) times daily.    Yes [provider]  omeprazole (PRILOSEC) 20 MG capsule Take 20 mg by mouth every morning.    Yes [provider]  tiZANidine (ZANAFLEX) 2 MG tablet Take by mouth. 12/06/18 12/06/19 Yes [provider]  amLODipine (NORVASC) 10 MG tablet Take 10 mg by mouth every morning.     [provider]  BAYER CONTOUR TEST test strip 1 strip by Other route as needed. 08/23/14   [provider]  calcium carbonate (TUMS - DOSED IN MG ELEMENTAL CALCIUM) 500 MG chewable  tablet Chew 3 tablets by mouth 2 (two) times daily with a meal.    [provider]  cloNIDine (CATAPRES) 0.1 MG tablet Take 1 tablet (0.1 mg total) by mouth 2 (two) times daily. 11/03/18   Payton Mccallumonty, Vivion Romano, MD  MICROLET LANCETS MISC 1 Stick by Other route daily. 08/23/14   [provider]  traMADol (ULTRAM) 50 MG tablet Take 1 tablet (50 mg total) by mouth every 6 (six) hours as needed. 03/08/18   Domenick GongMortenson, Ashley, MD    Family History Family History  Problem Relation Age of Onset  . Breast cancer Maternal Aunt     Social History Social History   Tobacco Use  . Smoking status: Former Smoker     Packs/day: 0.25  . Smokeless tobacco: Never Used  Substance Use Topics  . Alcohol use: Yes    Comment: occasionally  . Drug use: No     Allergies   Hydrocodone-acetaminophen   Review of Systems Review of Systems   Physical Exam Triage Vital Signs ED Triage Vitals  Enc Vitals Group     BP 12/14/18 1610 (!) 225/76     Pulse Rate 12/14/18 1610 67     Resp 12/14/18 1610 17     Temp 12/14/18 1610 97.6 F (36.4 C)     Temp Source 12/14/18 1610 Oral     SpO2 12/14/18 1610 98 %     Weight 12/14/18 1606 250 lb (113.4 kg)     Height 12/14/18 1606 5' (1.524 m)     Head Circumference --      Peak Flow --      Pain Score 12/14/18 1606 0     Pain Loc --      Pain Edu? --      Excl. in GC? --    No data found.  Updated Vital Signs BP (!) 172/75 (BP Location: Right Arm)   Pulse 67   Temp 97.6 F (36.4 C) (Oral)   Resp 17   Ht 5' (1.524 m)   Wt 113.4 kg   SpO2 98%   BMI 48.82 kg/m   Visual Acuity Right Eye Distance:   Left Eye Distance:   Bilateral Distance:    Right Eye Near:   Left Eye Near:    Bilateral Near:     Physical Exam Vitals signs and nursing note reviewed.  Constitutional:      General: She is not in acute distress.    Appearance: She is not toxic-appearing or diaphoretic.  Neck:     Musculoskeletal: Neck supple.  Cardiovascular:     Rate and Rhythm: Normal rate and regular rhythm.     Heart sounds: Normal heart sounds.  Pulmonary:     Effort: Pulmonary effort is normal. No respiratory distress.     Breath sounds: Normal breath sounds. No stridor. No wheezing, rhonchi or rales.  Neurological:     General: No focal deficit present.     Mental Status: She is alert and oriented to person, place, and time.     Cranial Nerves: No cranial nerve deficit.     Sensory: No sensory deficit.     Motor: No weakness.     Coordination: Coordination normal.     Gait: Gait normal.      UC Treatments / Results  Labs (all labs ordered are listed, but  only abnormal results are displayed) Labs Reviewed  BASIC METABOLIC PANEL - Abnormal; Notable for the following components:      Result Value  Sodium 129 (*)    Potassium 3.3 (*)    Chloride 90 (*)    Glucose, Bld 121 (*)    All other components within normal limits    EKG   Radiology No results found.  Procedures ED EKG  Date/Time: 12/14/2018 5:02 PM Performed by: Payton Mccallum, MD Authorized by: Payton Mccallum, MD   ECG reviewed by ED Physician in the absence of a cardiologist: yes   Previous ECG:    Previous ECG:  Compared to current   Similarity:  No change Interpretation:    Interpretation: normal   Rate:    ECG rate:  62   ECG rate assessment: normal   Rhythm:    Rhythm: sinus rhythm   Ectopy:    Ectopy: none   QRS:    QRS axis:  Normal Conduction:    Conduction: normal   ST segments:    ST segments:  Normal T waves:    T waves: normal     (including critical care time)  Medications Ordered in UC Medications  cloNIDine (CATAPRES) tablet 0.1 mg (0.1 mg Oral Given 12/14/18 1635)    Initial Impression / Assessment and Plan / UC Course  I have reviewed the triage vital signs and the nursing notes.  Pertinent labs & imaging results that were available during my care of the patient were reviewed by me and considered in my medical decision making (see chart for details).      Final Clinical Impressions(s) / UC Diagnoses   Final diagnoses:  Essential hypertension    ED Prescriptions    None     1. Labs/ekg results and diagnosis reviewed with patient; labs/ekg show no end organ damage; no neuro deficits; asymptomatic 2. Patient given clonidine 0.1mg  po x 1 with improvement on blood pressure 3. Recommend supportive treatment with  4. Close follow-up with PCP on Monday (48 hours) 5. Go to ED if blood pressure increases and develops symptoms  Controlled Substance Prescriptions [place] Controlled Substance Registry consulted? Not Applicable   Payton Mccallum, MD 12/14/18 [phone]

## 2018-12-14 NOTE — ED Notes (Signed)
Pt assisted by this RN to the triage bathroom, via wheelchair, to void; pt able to get herself up to and from the wheelchair without assist

## 2018-12-14 NOTE — ED Notes (Signed)
Spoke with Dr Archie Balboa regarding pt's earlier visit to urgent care and current blood pressure/complaint; no orders for blood work given at this time

## 2018-12-14 NOTE — ED Triage Notes (Addendum)
Patient c/o elevated BP for the past couple of days.  Patient states that she is still is still taking Zanaflex. Patient denies any pain.

## 2018-12-14 NOTE — Discharge Instructions (Signed)
Follow up with Primary Care provider on Monday

## 2018-12-15 ENCOUNTER — Emergency Department: Payer: Medicare Other

## 2018-12-15 ENCOUNTER — Emergency Department
Admission: EM | Admit: 2018-12-15 | Discharge: 2018-12-15 | Disposition: A | Discharge status: Home / Self Care | Payer: Medicare Other | Attending: Emergency Medicine | Admitting: Emergency Medicine

## 2018-12-15 DIAGNOSIS — I10 Essential (primary) hypertension: Secondary | ICD-10-CM | POA: Diagnosis not present

## 2018-12-15 LAB — GLUCOSE, CAPILLARY: Glucose-Capillary: 117 mg/dL — ABNORMAL HIGH (ref 70–99)

## 2018-12-15 NOTE — ED Notes (Signed)
Patient to stat desk in no acute distress asking about wait time. Patient given update on wait time. Patient verbalizes understanding.  

## 2018-12-15 NOTE — ED Provider Notes (Signed)
[facility] Emergency Department Provider Note _______________   First MD Initiated Contact with Patient 12/15/18 [phone]     (approximate)  I have reviewed the triage vital signs and the nursing notes.   HISTORY  Chief Complaint Hypertension    HPI Alicia [name] is a 78 y.o. female with below listed previous medical conditions including diabetes elevated cholesterol and hypertension presents emergency department secondary to hypertension noted tonight at home.  Patient states that her wrist blood pressure cuff revealed a systolic blood pressure of 215 patient could not recall the diastolic pressure.  Patient also admits to frontal headache however denies any weakness numbness gait instability or visual changes.  Patient denies any nausea or vomiting.  Patient was seen yesterday at urgent care secondary to elevated blood pressure.  Patient states that she was recently seen by her primary care provider for the same and her losartan dose was increased however she just filled the prescription yesterday and has taken 1 dose which she did before arriving to the emergency department.  Patient denies any chest pain no shortness of breath.     Past Medical History:  Diagnosis Date  . Diabetes mellitus without complication ([place])   . Elevated cholesterol   . Hypertension     Patient Active Problem List   Diagnosis Date Noted  . 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 ANKLE FRACTURE SURGERY Right 2012  . CATARACT EXTRACTION W/ INTRAOCULAR LENS IMPLANT Left 2008  . DILATION AND CURETTAGE OF UTERUS N/A 1978  . FRACTURE SURGERY    . SHOULDER HEMI-ARTHROPLASTY Left 11/17/2014   Procedure: SHOULDER HEMI-ARTHROPLASTY;  Surgeon: Corky Mull, MD;  Location: [facility] ORS;  Service: Orthopedics;  Laterality: Left;  . TONSILLECTOMY  1950  . TUBAL LIGATION Bilateral 1978    Prior to Admission  medications   Medication Sig Start Date End Date Taking? Authorizing Provider  amLODipine (NORVASC) 10 MG tablet Take 10 mg by mouth every morning.     [provider]  aspirin 81 MG tablet Take 81 mg by mouth every evening.     [provider]  atorvastatin (LIPITOR) 20 MG tablet Take 20 mg by mouth daily at 6 PM.     [provider]  BAYER CONTOUR TEST test strip 1 strip by Other route as needed. 08/23/14   [provider]  calcium carbonate (TUMS - DOSED IN MG ELEMENTAL CALCIUM) 500 MG chewable tablet Chew 3 tablets by mouth 2 (two) times daily with a meal.    [provider]  Calcium Citrate-Vitamin D (CALCIUM + D PO) Take 1 tablet by mouth every evening.    [provider]  cloNIDine (CATAPRES) 0.1 MG tablet Take 1 tablet (0.1 mg total) by mouth 2 (two) times daily. 11/03/18   Norval Gable, MD  ferrous sulfate 325 (65 FE) MG tablet Take by mouth.    [provider]  fluticasone (FLONASE) 50 MCG/ACT nasal spray Place 2 sprays into both nostrils daily. 11/03/14   [provider]  furosemide (LASIX) 20 MG tablet Take 20 mg by mouth 3 (three) times a week. As needed for swelling    [provider]  hydrochlorothiazide (HYDRODIURIL) 25 MG tablet Take 25 mg by mouth every morning.     [provider]  losartan (COZAAR) 50 MG tablet Take 50 mg by mouth every morning. 1.5 tablets every am    [provider]  metFORMIN (GLUCOPHAGE) 500 MG tablet Take by mouth 2 (two) times daily with a meal.    [provider]  metoprolol succinate (TOPROL-XL) 100 MG 24 hr tablet Take 100 mg by mouth every morning. Take with or immediately following a meal.    [provider]  MICROLET LANCETS MISC 1 Stick by Other route daily. 08/23/14   [provider]  Omega-3 Fatty Acids ([employer] FISH OIL) 1000 MG CAPS Take 1 capsule by mouth 2 (two) times daily.     [provider]  omeprazole (PRILOSEC)  20 MG capsule Take 20 mg by mouth every morning.     [provider]  tiZANidine (ZANAFLEX) 2 MG tablet Take by mouth. 12/06/18 12/06/19  [provider]  traMADol (ULTRAM) 50 MG tablet Take 1 tablet (50 mg total) by mouth every 6 (six) hours as needed. 03/08/18   Domenick GongMortenson, Ashley, MD    Allergies Hydrocodone-acetaminophen  Family History  Problem Relation Age of Onset  . Breast cancer Maternal Aunt     Social History Social History   Tobacco Use  . Smoking status: Former Smoker    Packs/day: 0.25  . Smokeless tobacco: Never Used  Substance Use Topics  . Alcohol use: Yes    Comment: occasionally  . Drug use: No    Review of Systems Constitutional: No fever/chills Eyes: No visual changes. ENT: No sore throat. Cardiovascular: Denies chest pain. Respiratory: Denies shortness of breath. Gastrointestinal: No abdominal pain.  No nausea, no vomiting.  No diarrhea.  No constipation. Genitourinary: Negative for dysuria. Musculoskeletal: Negative for neck pain.  Negative for back pain. Integumentary: Negative for rash. Neurological: Negative for headaches, focal weakness or numbness.   ____________________________________________   PHYSICAL EXAM:  VITAL SIGNS: ED Triage Vitals  Enc Vitals Group     BP 12/14/18 2118 (!) 164/67     Pulse Rate 12/14/18 2118 65     Resp 12/14/18 2118 18     Temp 12/14/18 2118 98.3 F (36.8 C)     Temp Source 12/14/18 2118 Oral     SpO2 12/14/18 2118 96 %     Weight 12/14/18 2114 114 kg (251 lb 5.2 oz)     Height 12/14/18 2114 1.524 m (5')     Head Circumference --      Peak Flow --      Pain Score 12/14/18 2125 6     Pain Loc --      Pain Edu? --      Excl. in GC? --     Constitutional: Alert and oriented.  Eyes: Conjunctivae are normal.  Mouth/Throat: Mucous membranes are moist. Neck: No stridor.  No meningeal signs.   Cardiovascular: Normal rate, regular rhythm. Good peripheral circulation. Grossly normal heart  sounds. Respiratory: Normal respiratory effort.  No retractions. Gastrointestinal: Soft and nontender. No distention.  Musculoskeletal: No lower extremity tenderness nor edema. No gross deformities of extremities. Neurologic:  Normal speech and language. No gross focal neurologic deficits are appreciated.  Skin:  Skin is warm, dry and intact. Psychiatric: Mood and affect are normal. Speech and behavior are normal.  ____________________________________________   LABS (all labs ordered are listed, but only abnormal results are displayed)  Labs Reviewed  GLUCOSE, CAPILLARY - Abnormal; Notable for the following components:      Result Value   Glucose-Capillary 117 (*)    All other components within normal limits   ____________________________________________  EKG  ED ECG REPORT I, [place] N , the attending physician, personally  viewed and interpreted this ECG.   Date: 12/14/2018  EKG Time: 9:17 PM  Rate: 63  Rhythm: Normal sinus rhythm  Axis: Normal  Intervals:Normal  ST&T Change: None  ____________________________________________  RADIOLOGY I, [place] N , personally viewed and evaluated these images (plain radiographs) as part of my medical decision making, as well as reviewing the written report by the radiologist.  ED MD interpretation: No acute intracranial abnormality.  Official radiology report(s): Ct Head Wo Contrast  Result Date: 12/15/2018 CLINICAL DATA:  Headache, hypertension EXAM: CT HEAD WITHOUT CONTRAST TECHNIQUE: Contiguous axial images were obtained from the base of the skull through the vertex without intravenous contrast. COMPARISON:  None. FINDINGS: Brain: No acute intracranial abnormality. Specifically, no hemorrhage, hydrocephalus, mass lesion, acute infarction, or significant intracranial injury. Vascular: No hyperdense vessel or unexpected calcification. Skull: No acute calvarial abnormality. Sinuses/Orbits: Visualized paranasal sinuses and  mastoids clear. Orbital soft tissues unremarkable. Other: None IMPRESSION: No acute intracranial abnormality. Electronically Signed   By: Charlett Nose M.D.   On: 12/15/2018 03:48      Procedures   ____________________________________________   INITIAL IMPRESSION / MDM / ASSESSMENT AND PLAN / ED COURSE  As part of my medical decision making, I reviewed the following data within the electronic MEDICAL RECORD NUMBER   78 year old female presented with above-stated history and physical exam secondary to hypertension.  Patient with no focal neurological deficits however does have a headache CT head was performed which was unremarkable.  Without any intervention the patient's blood pressure is currently 129/78.  Patient advised to follow-up with primary care provider  ____________________________________________  FINAL CLINICAL IMPRESSION(S) / ED DIAGNOSES  Final diagnoses:  Hypertension, unspecified type     MEDICATIONS GIVEN DURING THIS VISIT:  Medications - No data to display   ED Discharge Orders    None      *Please note:  Chinelle Ordones was evaluated in Emergency Department on 12/15/2018 for the symptoms described in the history of present illness. She was evaluated in the context of the global COVID-19 pandemic, which necessitated consideration that the patient might be at risk for infection with the SARS-CoV-2 virus that causes COVID-19. Institutional protocols and algorithms that pertain to the evaluation of patients at risk for COVID-19 are in a state of rapid change based on information released by regulatory bodies including the CDC and federal and state organizations. These policies and algorithms were followed during the patient's care in the ED.  Some ED evaluations and interventions may be delayed as a result of limited staffing during the pandemic.*  Note:  This document was prepared using Dragon voice recognition software and may include unintentional dictation errors.    Darci Current, MD 12/15/18 [id number]

## 2018-12-15 NOTE — ED Notes (Signed)
Patient assisted to bathroom 

## 2018-12-15 NOTE — ED Notes (Signed)
Patient reports change of medication today due to high b/p. Presents to ed with c/o of high B/P. md at bedside to assess and discuss further plan of care.

## 2019-03-02 ENCOUNTER — Other Ambulatory Visit: Payer: Self-pay

## 2019-03-02 ENCOUNTER — Ambulatory Visit
Admission: EM | Admit: 2019-03-02 | Discharge: 2019-03-02 | Disposition: A | Discharge status: Home / Self Care | Payer: Medicare Other | Attending: Family Medicine | Admitting: Family Medicine

## 2019-03-02 ENCOUNTER — Encounter: Payer: Self-pay | Admitting: Emergency Medicine

## 2019-03-02 DIAGNOSIS — Z79899 Other long term (current) drug therapy: Secondary | ICD-10-CM | POA: Insufficient documentation

## 2019-03-02 DIAGNOSIS — I10 Essential (primary) hypertension: Secondary | ICD-10-CM | POA: Insufficient documentation

## 2019-03-02 DIAGNOSIS — H6121 Impacted cerumen, right ear: Secondary | ICD-10-CM

## 2019-03-02 DIAGNOSIS — Z885 Allergy status to narcotic agent status: Secondary | ICD-10-CM | POA: Insufficient documentation

## 2019-03-02 DIAGNOSIS — E119 Type 2 diabetes mellitus without complications: Secondary | ICD-10-CM | POA: Insufficient documentation

## 2019-03-02 DIAGNOSIS — Z7984 Long term (current) use of oral hypoglycemic drugs: Secondary | ICD-10-CM | POA: Insufficient documentation

## 2019-03-02 DIAGNOSIS — Z7982 Long term (current) use of aspirin: Secondary | ICD-10-CM | POA: Insufficient documentation

## 2019-03-02 DIAGNOSIS — R0981 Nasal congestion: Secondary | ICD-10-CM | POA: Diagnosis not present

## 2019-03-02 DIAGNOSIS — Z20828 Contact with and (suspected) exposure to other viral communicable diseases: Secondary | ICD-10-CM | POA: Diagnosis not present

## 2019-03-02 MED ORDER — IPRATROPIUM BROMIDE 0.06 % NA SOLN: 2.0000 | Freq: Four times a day (QID) | NASAL | 0 refills | Status: AC | PRN

## 2019-03-02 NOTE — ED Provider Notes (Signed)
[facility]    CSN: [id number] Arrival date & time: 03/02/19  1058  History   Chief Complaint Chief Complaint  Patient presents with  . Sinus Problem   HPI  78 year old female presents with respiratory symptoms.  Patient reports sinus pressure behind her nose for the past few days.  Feels congested.  Denies fever.  Also reports some ear discomfort/fullness.  Rates her pain as 4/10 in severity.  No reported sick contacts.  No known exacerbating factors.  No known relieving factors.  No other reported symptoms.   PMH, Surgical Hx, Family Hx, Social History reviewed and updated as below.  Past Medical History:  Diagnosis Date  . Diabetes mellitus without complication ([place])   . Elevated cholesterol   . Hypertension    Patient Active Problem List   Diagnosis Date Noted  . 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 ANKLE FRACTURE SURGERY Right 2012  . CATARACT EXTRACTION W/ INTRAOCULAR LENS IMPLANT Left 2008  . DILATION AND CURETTAGE OF UTERUS N/A 1978  . FRACTURE SURGERY    . SHOULDER HEMI-ARTHROPLASTY Left 11/17/2014   Procedure: SHOULDER HEMI-ARTHROPLASTY;  Surgeon: Corky Mull, MD;  Location: [facility] OR[facility];  Service: Orthopedics;  Laterality: Left;  . TONSILLECTOMY  1950  . TUBAL LIGATION Bilateral 1978   OB History   No obstetric history on file.    Home Medications    Prior to Admission medications   Medication Sig Start Date End Date Taking? Authorizing Provider  amLODipine (NORVASC) 10 MG tablet Take 10 mg by mouth every morning.    Yes [provider]  aspirin 81 MG tablet Take 81 mg by mouth every evening.    Yes [provider]  atorvastatin (LIPITOR) 20 MG tablet Take 20 mg by mouth daily at 6 PM.    Yes [provider]  calcium carbonate (TUMS - DOSED IN MG ELEMENTAL CALCIUM) 500 MG chewable tablet Chew 3 tablets by mouth 2 (two) times daily with a meal.    Yes [provider]  Calcium Citrate-Vitamin D (CALCIUM + D PO) Take 1 tablet by mouth every evening.   Yes [provider]  cloNIDine (CATAPRES) 0.1 MG tablet Take 1 tablet (0.1 mg total) by mouth 2 (two) times daily. 11/03/18  Yes Norval Gable, MD  ferrous sulfate 325 (65 FE) MG tablet Take by mouth.   Yes [provider]  fluticasone (FLONASE) 50 MCG/ACT nasal spray Place 2 sprays into both nostrils daily. 11/03/14  Yes [provider]  furosemide (LASIX) 20 MG tablet Take 20 mg by mouth 3 (three) times a week. As needed for swelling   Yes [provider]  hydrochlorothiazide (HYDRODIURIL) 25 MG tablet Take 25 mg by mouth every morning.    Yes [provider]  losartan (COZAAR) 50 MG tablet Take 50 mg by mouth every morning. 1.5 tablets every am   Yes [provider]  metFORMIN (GLUCOPHAGE) 500 MG tablet Take by mouth 2 (two) times daily with a meal.   Yes [provider]  metoprolol succinate (TOPROL-XL) 100 MG 24 hr tablet Take 100 mg by mouth every morning. Take with or immediately following a meal.   Yes [provider]  Omega-3 Fatty Acids ([employer] FISH OIL) 1000 MG CAPS Take 1 capsule by mouth 2 (two) times daily.    Yes [provider]  omeprazole (PRILOSEC) 20 MG capsule Take 20 mg by mouth  every morning.    Yes [provider]  tiZANidine (ZANAFLEX) 2 MG tablet Take by mouth. 12/06/18 12/06/19 Yes [provider]  traMADol (ULTRAM) 50 MG tablet Take 1 tablet (50 mg total) by mouth every 6 (six) hours as needed. 03/08/18  Yes Domenick GongMortenson, Ashley, MD  BAYER CONTOUR TEST test strip 1 strip by Other route as needed. 08/23/14   [provider]  ipratropium (ATROVENT) 0.06 % nasal spray Place 2 sprays into both nostrils 4 (four) times daily as needed for rhinitis. 03/02/19   Tommie Samsook, Anabella Capshaw G, DO  MICROLET LANCETS MISC 1 Stick by Other route daily. 08/23/14   [provider]    Family History Family History  Problem Relation Age of Onset  . Breast cancer Maternal Aunt    Social History Social History   Tobacco Use  . Smoking status: Former Smoker    Packs/day: 0.25  . Smokeless tobacco: Never Used  Substance Use Topics  . Alcohol use: Yes    Comment: occasionally  . Drug use: No   Allergies   Hydrocodone-acetaminophen   Review of Systems Review of Systems  Constitutional: Negative.   HENT: Positive for congestion and ear pain.    Physical Exam Triage Vital Signs ED Triage Vitals  Enc Vitals Group     BP 03/02/19 1123 (!) 162/67     Pulse Rate 03/02/19 1123 72     Resp 03/02/19 1123 16     Temp 03/02/19 1123 98.1 F (36.7 C)     Temp Source 03/02/19 1123 Oral     SpO2 03/02/19 1123 96 %     Weight 03/02/19 1120 250 lb (113.4 kg)     Height 03/02/19 1120 5' (1.524 m)     Head Circumference --      Peak Flow --      Pain Score 03/02/19 1119 4     Pain Loc --      Pain Edu? --      Excl. in GC? --    Updated Vital Signs BP (!) 162/67 (BP Location: Left Arm)   Pulse 72   Temp 98.1 F (36.7 C) (Oral)   Resp 16   Ht 5' (1.524 m)   Wt 113.4 kg   SpO2 96%   BMI 48.82 kg/m   Visual Acuity Right Eye Distance:   Left Eye Distance:   Bilateral Distance:    Right Eye Near:   Left Eye Near:    Bilateral Near:     Physical Exam Vitals signs and nursing note reviewed.  Constitutional:      General: She is not in acute distress.    Appearance: Normal appearance. She is obese. She is not ill-appearing.  HENT:     Head: Normocephalic and atraumatic.     Ears:     Comments: Right ear - cerumen impaction.    Nose: Nose normal. No rhinorrhea.     Mouth/Throat:     Pharynx: Oropharynx is clear. No posterior oropharyngeal erythema.  Eyes:     General:        Right eye: No discharge.        Left eye: No discharge.     Conjunctiva/sclera: Conjunctivae normal.  Cardiovascular:     Rate and Rhythm: Normal rate and regular rhythm.   Pulmonary:     Effort: Pulmonary effort is normal.     Breath sounds: Normal breath sounds. No wheezing, rhonchi or rales.  Neurological:     Mental Status: She  is alert.  Psychiatric:        Mood and Affect: Mood normal.        Behavior: Behavior normal.    UC Treatments / Results  Labs (all labs ordered are listed, but only abnormal results are displayed) Labs Reviewed  NOVEL CORONAVIRUS, NAA (HOSP ORDER, SEND-OUT TO REF LAB; TAT 18-24 HRS)    EKG   Radiology No results found.  Procedures Procedures (including critical care time)  Medications Ordered in UC Medications - No data to display  Initial Impression / Assessment and Plan / UC Course  I have reviewed the triage vital signs and the nursing notes.  Pertinent labs & imaging results that were available during my care of the patient were reviewed by me and considered in my medical decision making (see chart for details).    78 year old female presents with sinus congestion and cerumen impaction.  She is well-appearing on exam.  I do not feel that she has bacterial sinusitis.  Treating with Atrovent nasal spray.  Irrigation performed today with success.   Final Clinical Impressions(s) / UC Diagnoses   Final diagnoses:  Sinus congestion  Impacted cerumen of right ear     Discharge Instructions     Medication as directed.  Take care  Dr. Adriana Simas     ED Prescriptions    Medication Sig Dispense Auth. Provider   ipratropium (ATROVENT) 0.06 % nasal spray Place 2 sprays into both nostrils 4 (four) times daily as needed for rhinitis. 15 mL Tommie Sams, DO     PDMP not reviewed this encounter.   Tommie Sams, [place] 03/02/19 [phone]

## 2019-03-02 NOTE — ED Triage Notes (Signed)
Patient c/o sinus pressure and pressure behind her nose for a week.  Patient denies fevers.

## 2019-03-02 NOTE — Discharge Instructions (Signed)
Medication as directed. ° °Take care ° °Dr. Nasira Janusz  °

## 2019-03-03 LAB — NOVEL CORONAVIRUS, NAA (HOSP ORDER, SEND-OUT TO REF LAB; TAT 18-24 HRS): SARS-CoV-2, NAA: NOT DETECTED

## 2019-03-29 ENCOUNTER — Encounter: Payer: Self-pay | Admitting: Gynecology

## 2019-03-29 ENCOUNTER — Ambulatory Visit
Admission: EM | Admit: 2019-03-29 | Discharge: 2019-03-29 | Disposition: A | Discharge status: Home / Self Care | Payer: Medicare Other | Attending: Family Medicine | Admitting: Family Medicine

## 2019-03-29 DIAGNOSIS — I10 Essential (primary) hypertension: Secondary | ICD-10-CM | POA: Diagnosis not present

## 2019-03-29 DIAGNOSIS — Z79899 Other long term (current) drug therapy: Secondary | ICD-10-CM | POA: Diagnosis not present

## 2019-03-29 DIAGNOSIS — H8110 Benign paroxysmal vertigo, unspecified ear: Secondary | ICD-10-CM | POA: Insufficient documentation

## 2019-03-29 LAB — GLUCOSE, CAPILLARY: Glucose-Capillary: 116 mg/dL — ABNORMAL HIGH (ref 70–99)

## 2019-03-29 NOTE — Discharge Instructions (Addendum)
Over the counter meclizine as needed Continue current medications Follow up with Primary Care provider

## 2019-03-29 NOTE — ED Triage Notes (Signed)
Per pt. Lighted headed this am and check her blood sugar and it was 188. Pt. Stated when she check her blood pressure this am 203 on her wrist. Per patient not sure of the bottom number.

## 2019-03-29 NOTE — ED Provider Notes (Signed)
[facility]    CSN: [id number] Arrival date & time: 03/29/19  1547      History   Chief Complaint No chief complaint on file.   HPI Alicia [name] is a 78 y.o. female.   78 yo female with a c/o an episode of a brief episode of vertigo this morning. States lasted several seconds but scared her. She checked her blood sugar-188 and her blood pressure which was 203 systolic per patient reading. Denies any numbness/tingling, unilateral weakness, vision problems, headache, chest pains, palpitations, fevers, chills. States recently had some nasal congestion. Also states she's had vertigo problems in the past.      Past Medical History:  Diagnosis Date  . Diabetes mellitus without complication ([facility])   . Elevated cholesterol   . Hypertension     Patient Active Problem List   Diagnosis Date Noted  . 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 ANKLE FRACTURE SURGERY Right 2012  . CATARACT EXTRACTION W/ INTRAOCULAR LENS IMPLANT Left 2008  . DILATION AND CURETTAGE OF UTERUS N/A 1978  . FRACTURE SURGERY    . SHOULDER HEMI-ARTHROPLASTY Left 11/17/2014   Procedure: SHOULDER HEMI-ARTHROPLASTY;  Surgeon: Christena FlakeJohn J Poggi, MD;  Location: [facility];  Service: Orthopedics;  Laterality: Left;  . TONSILLECTOMY  1950  . TUBAL LIGATION Bilateral 1978    OB History   No obstetric history on file.      Home Medications    Prior to Admission medications   Medication Sig Start Date End Date Taking? Authorizing Provider  amLODipine (NORVASC) 10 MG tablet Take 10 mg by mouth every morning.    Yes [provider]  aspirin 81 MG tablet Take 81 mg by mouth every evening.    Yes [provider]  atorvastatin (LIPITOR) 20 MG tablet Take 20 mg by mouth daily at 6 PM.    Yes [provider]  BAYER CONTOUR TEST test strip 1 strip by Other route as needed. 08/23/14  Yes [provider]    Calcium Citrate-Vitamin D (CALCIUM + D PO) Take 1 tablet by mouth every evening.   Yes [provider]  cloNIDine (CATAPRES) 0.1 MG tablet Take 1 tablet (0.1 mg total) by mouth 2 (two) times daily. 11/03/18  Yes Payton Mccallumonty, Estill Llerena, MD  ferrous sulfate 325 (65 FE) MG tablet Take by mouth.   Yes [provider]  fluticasone (FLONASE) 50 MCG/ACT nasal spray Place 2 sprays into both nostrils daily. 11/03/14  Yes [provider]  furosemide (LASIX) 20 MG tablet Take 20 mg by mouth 3 (three) times a week. As needed for swelling   Yes [provider]  hydrochlorothiazide (HYDRODIURIL) 25 MG tablet Take 25 mg by mouth every morning.    Yes [provider]  ipratropium (ATROVENT) 0.06 % nasal spray Place 2 sprays into both nostrils 4 (four) times daily as needed for rhinitis. 03/02/19  Yes Cook, Jayce G, DO  losartan (COZAAR) 50 MG tablet Take 50 mg by mouth every morning. 1.5 tablets every am   Yes [provider]  metFORMIN (GLUCOPHAGE) 500 MG tablet Take by mouth 2 (two) times daily with a meal.   Yes [provider]  metoprolol succinate (TOPROL-XL) 100 MG 24 hr tablet Take 100 mg by mouth every morning. Take with or immediately following a meal.   Yes [provider]  MICROLET LANCETS MISC 1 Stick by Other route daily. 08/23/14  Yes  [provider]  Omega-3 Fatty Acids ([employer] FISH OIL) 1000 MG CAPS Take 1 capsule by mouth 2 (two) times daily.    Yes [provider]  omeprazole (PRILOSEC) 20 MG capsule Take 20 mg by mouth every morning.    Yes [provider]  tiZANidine (ZANAFLEX) 2 MG tablet Take by mouth. 12/06/18 12/06/19 Yes [provider]  traMADol (ULTRAM) 50 MG tablet Take 1 tablet (50 mg total) by mouth every 6 (six) hours as needed. 03/08/18  Yes Domenick Gong, MD  calcium carbonate (TUMS - DOSED IN MG ELEMENTAL CALCIUM) 500 MG chewable tablet Chew 3 tablets by mouth 2 (two) times daily with a  meal.    [provider]    Family History Family History  Problem Relation Age of Onset  . Breast cancer Maternal Aunt     Social History Social History   Tobacco Use  . Smoking status: Former Smoker    Packs/day: 0.25  . Smokeless tobacco: Never Used  Substance Use Topics  . Alcohol use: Yes    Comment: occasionally  . Drug use: No     Allergies   Hydrocodone-acetaminophen   Review of Systems Review of Systems   Physical Exam Triage Vital Signs ED Triage Vitals  Enc Vitals Group     BP 03/29/19 1603 (!) 153/85     Pulse Rate 03/29/19 1603 71     Resp 03/29/19 1603 18     Temp 03/29/19 1603 97.7 F (36.5 C)     Temp Source 03/29/19 1603 Oral     SpO2 03/29/19 1603 99 %     Weight 03/29/19 1605 248 lb (112.5 kg)     Height 03/29/19 1605 5' (1.524 m)     Head Circumference --      Peak Flow --      Pain Score 03/29/19 1605 0     Pain Loc --      Pain Edu? --      Excl. in GC? --    No data found.  Updated Vital Signs BP (!) 153/85 (BP Location: Left Arm)   Pulse 71   Temp 97.7 F (36.5 C) (Oral)   Resp 18   Ht 5' (1.524 m)   Wt 112.5 kg   SpO2 99%   BMI 48.43 kg/m   Visual Acuity Right Eye Distance:   Left Eye Distance:   Bilateral Distance:    Right Eye Near:   Left Eye Near:    Bilateral Near:     Physical Exam Vitals and nursing note reviewed.  Constitutional:      General: She is not in acute distress.    Appearance: She is not toxic-appearing or diaphoretic.  Cardiovascular:     Rate and Rhythm: Normal rate and regular rhythm.     Pulses: Normal pulses.     Heart sounds: Normal heart sounds.  Pulmonary:     Effort: Pulmonary effort is normal. No respiratory distress.     Breath sounds: Normal breath sounds. No stridor. No wheezing, rhonchi or rales.  Neurological:     General: No focal deficit present.     Mental Status: She is alert.      UC Treatments / Results  Labs (all labs ordered are listed, but only  abnormal results are displayed) Labs Reviewed  GLUCOSE, CAPILLARY - Abnormal; Notable for the following components:      Result Value   Glucose-Capillary 116 (*)    All other components within normal  limits  CBG MONITORING, ED    EKG   Radiology No results found.  Procedures Procedures (including critical care time)  Medications Ordered in UC Medications - No data to display  Initial Impression / Assessment and Plan / UC Course  I have reviewed the triage vital signs and the nursing notes.  Pertinent labs & imaging results that were available during my care of the patient were reviewed by me and considered in my medical decision making (see chart for details).      Final Clinical Impressions(s) / UC Diagnoses   Final diagnoses:  Essential hypertension  Benign paroxysmal positional vertigo, unspecified laterality     Discharge Instructions     Over the counter meclizine as needed Continue current medications Follow up with Primary Care provider     ED Prescriptions    None      1. Lab results and diagnosis reviewed with patient 2. Recommend supportive treatment as above 3. Follow up with PCP 4. Follow-up prn if symptoms worsen or don't improve  PDMP not reviewed this encounter.   Norval Gable, MD 03/29/19 1655

## 2019-07-22 ENCOUNTER — Other Ambulatory Visit: Payer: Self-pay

## 2019-07-22 ENCOUNTER — Encounter: Payer: Self-pay | Admitting: Emergency Medicine

## 2019-07-22 ENCOUNTER — Ambulatory Visit
Admission: EM | Admit: 2019-07-22 | Discharge: 2019-07-22 | Disposition: A | Discharge status: Home / Self Care | Payer: Medicare Other | Attending: Family Medicine | Admitting: Family Medicine

## 2019-07-22 DIAGNOSIS — R42 Dizziness and giddiness: Secondary | ICD-10-CM | POA: Diagnosis not present

## 2019-07-22 LAB — URINALYSIS, COMPLETE (UACMP) WITH MICROSCOPIC
Glucose, UA: NEGATIVE mg/dL
Hgb urine dipstick: NEGATIVE
Nitrite: NEGATIVE
Protein, ur: NEGATIVE mg/dL
RBC / HPF: NONE SEEN RBC/hpf (ref 0–5)
Specific Gravity, Urine: 1.015 (ref 1.005–1.030)
pH: 5.5 (ref 5.0–8.0)

## 2019-07-22 LAB — GLUCOSE, CAPILLARY: Glucose-Capillary: 99 mg/dL (ref 70–99)

## 2019-07-22 MED ORDER — MECLIZINE HCL 25 MG PO TABS: 25.0000 mg | ORAL_TABLET | Freq: Three times a day (TID) | ORAL | 0 refills | Status: AC | PRN

## 2019-07-22 NOTE — Discharge Instructions (Signed)
Rest. Fluids. ° °Medication as needed. ° °Take care ° °Dr. Avraj Lindroth  °

## 2019-07-22 NOTE — ED Provider Notes (Addendum)
[facility]    CSN: [id number] Arrival date & time: 07/22/19  1454      History   Chief Complaint Chief Complaint  Patient presents with  . Dizziness   HPI  79 year old female presents with dizziness.  Patient reports that she developed diaphoresis and lightheadedness this morning.  She states that it felt similar to prior episodes of vertigo.  She states that she felt shaky.  When asked to further elaborate on this she states that she felt anxious.  Patient reports that she has not urinated as frequently as she normally does.  She has been drinking fluids.  She has eaten as she normally does.  No reports of vomiting.  Denies chest pain.  Reports chronic shortness of breath due to obesity.  Denies pain at this time.  Is currently doing okay.  No known inciting factor.  No other associated symptoms.  No other complaints.  Past Medical History:  Diagnosis Date  . Diabetes mellitus without complication ([facility])   . Elevated cholesterol   . Hypertension     Patient Active Problem List   Diagnosis Date Noted  . 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 ANKLE FRACTURE SURGERY Right 2012  . CATARACT EXTRACTION W/ INTRAOCULAR LENS IMPLANT Left 2008  . DILATION AND CURETTAGE OF UTERUS N/A 1978  . FRACTURE SURGERY    . SHOULDER HEMI-ARTHROPLASTY Left 11/17/2014   Procedure: SHOULDER HEMI-ARTHROPLASTY;  Surgeon: Christena Flake, MD;  Location: [facility] OR[facility];  Service: Orthopedics;  Laterality: Left;  . TONSILLECTOMY  1950  . TUBAL LIGATION Bilateral 1978    OB History   No obstetric history on file.     Home Medications    Prior to Admission medications   Medication Sig Start Date End Date Taking? Authorizing Provider  amLODipine (NORVASC) 10 MG tablet Take 10 mg by mouth every morning.    Yes [provider]  aspirin 81 MG tablet Take 81 mg by mouth every evening.    Yes [provider]  atorvastatin (LIPITOR) 20 MG tablet Take 20 mg by mouth daily at 6 PM.    Yes [provider]  calcium carbonate (TUMS - DOSED IN MG ELEMENTAL CALCIUM) 500 MG chewable tablet Chew 3 tablets by mouth 2 (two) times daily with a meal.   Yes [provider]  Calcium Citrate-Vitamin D (CALCIUM + D PO) Take 1 tablet by mouth every evening.   Yes [provider]  cloNIDine (CATAPRES) 0.1 MG tablet Take 1 tablet (0.1 mg total) by mouth 2 (two) times daily. 11/03/18  Yes Payton Mccallum, MD  ferrous sulfate 325 (65 FE) MG tablet Take by mouth.   Yes [provider]  fluticasone (FLONASE) 50 MCG/ACT nasal spray Place 2 sprays into both nostrils daily. 11/03/14  Yes [provider]  furosemide (LASIX) 20 MG tablet Take 20 mg by mouth 3 (three) times a week. As needed for swelling   Yes [provider]  hydrochlorothiazide (HYDRODIURIL) 25 MG tablet Take 25 mg by mouth every morning.    Yes [provider]  losartan (COZAAR) 50 MG tablet Take 50 mg by mouth every morning. 1.5 tablets every am   Yes [provider]  metFORMIN (GLUCOPHAGE) 500 MG tablet Take by mouth 2 (two) times daily with a meal.   Yes [provider]  metoprolol succinate (TOPROL-XL) 100 MG 24 hr tablet Take 100 mg by mouth  every morning. Take with or immediately following a meal.   Yes [provider]  Omega-3 Fatty Acids ([employer] FISH OIL) 1000 MG CAPS Take 1 capsule by mouth 2 (two) times daily.    Yes [provider]  omeprazole (PRILOSEC) 20 MG capsule Take 20 mg by mouth every morning.    Yes [provider]  traMADol (ULTRAM) 50 MG tablet Take 1 tablet (50 mg total) by mouth every 6 (six) hours as needed. 03/08/18  Yes Domenick Gong, MD  BAYER CONTOUR TEST test strip 1 strip by Other route as needed. 08/23/14   [provider]  ipratropium (ATROVENT) 0.06 % nasal spray Place 2 sprays into both nostrils 4 (four) times  daily as needed for rhinitis. 03/02/19   Tommie Sams, DO  meclizine (ANTIVERT) 25 MG tablet Take 1 tablet (25 mg total) by mouth 3 (three) times daily as needed for dizziness. 07/22/19   Tommie Sams, DO  MICROLET LANCETS MISC 1 Stick by Other route daily. 08/23/14   [provider]    Family History Family History  Problem Relation Age of Onset  . Breast cancer Maternal Aunt     Social History Social History   Tobacco Use  . Smoking status: Former Smoker    Packs/day: 0.25  . Smokeless tobacco: Never Used  Substance Use Topics  . Alcohol use: Yes    Comment: occasionally  . Drug use: No     Allergies   Hydrocodone-acetaminophen   Review of Systems Review of Systems  Constitutional: Positive for diaphoresis.  Neurological: Positive for dizziness and light-headedness.  Psychiatric/Behavioral: The patient is nervous/anxious.    Physical Exam Triage Vital Signs ED Triage Vitals  Enc Vitals Group     BP 07/22/19 1515 (!) 143/114     Pulse Rate 07/22/19 1515 68     Resp 07/22/19 1515 20     Temp 07/22/19 1515 98.1 F (36.7 C)     Temp Source 07/22/19 1515 Oral     SpO2 07/22/19 1515 98 %     Weight 07/22/19 1516 249 lb (112.9 kg)     Height 07/22/19 1516 5' (1.524 m)     Head Circumference --      Peak Flow --      Pain Score 07/22/19 1516 0     Pain Loc --      Pain Edu? --      Excl. in GC? --    Updated Vital Signs BP (!) 143/114 (BP Location: Left Arm)   Pulse 68   Temp 98.1 F (36.7 C) (Oral)   Resp 20   Ht 5' (1.524 m)   Wt 112.9 kg   SpO2 98%   BMI 48.63 kg/m   Visual Acuity Right Eye Distance:   Left Eye Distance:   Bilateral Distance:    Right Eye Near:   Left Eye Near:    Bilateral Near:     Physical Exam Vitals and nursing note reviewed.  Constitutional:      General: She is not in acute distress.    Appearance: Normal appearance. She is obese. She is not ill-appearing.  HENT:     Head: Normocephalic and atraumatic.    Eyes:     General:        Right eye: No discharge.        Left eye: No discharge.     Conjunctiva/sclera: Conjunctivae normal.     Pupils: Pupils are equal, round, and reactive to  light.  Cardiovascular:     Rate and Rhythm: Normal rate and regular rhythm.     Heart sounds: No murmur.  Pulmonary:     Effort: Pulmonary effort is normal.     Breath sounds: Normal breath sounds. No wheezing, rhonchi or rales.  Neurological:     General: No focal deficit present.     Mental Status: She is alert.  Psychiatric:        Mood and Affect: Mood normal.        Behavior: Behavior normal.    UC Treatments / Results  Labs (all labs ordered are listed, but only abnormal results are displayed) Labs Reviewed  URINALYSIS, COMPLETE (UACMP) WITH MICROSCOPIC - Abnormal; Notable for the following components:      Result Value   APPearance HAZY (*)    Bilirubin Urine SMALL (*)    Ketones, ur TRACE (*)    Leukocytes,Ua SMALL (*)    Bacteria, UA FEW (*)    All other components within normal limits  GLUCOSE, CAPILLARY  CBG MONITORING, ED    EKG Interpretation: Normal sinus rhythm with rate of 64.  Normal axis.  No ST or T wave changes.  Unremarkable EKG.  Radiology No results found.  Procedures Procedures (including critical care time)  Medications Ordered in UC Medications - No data to display  Initial Impression / Assessment and Plan / UC Course  I have reviewed the triage vital signs and the nursing notes.  Pertinent labs & imaging results that were available during my care of the patient were reviewed by me and considered in my medical decision making (see chart for details).    79 year old female presents with dizziness and lightheadedness.  Well-appearing on exam.  Urinalysis unremarkable.  EKG unremarkable.  Advised rest and fluids.  Meclizine as needed.  Supportive care.  Final Clinical Impressions(s) / UC Diagnoses   Final diagnoses:  Dizziness     Discharge  Instructions     Rest. Fluids.  Medication as needed.  Take care  Dr. Lacinda Axon    ED Prescriptions    Medication Sig Dispense Auth. Provider   meclizine (ANTIVERT) 25 MG tablet Take 1 tablet (25 mg total) by mouth 3 (three) times daily as needed for dizziness. 30 tablet Coral Spikes, DO     PDMP not reviewed this encounter.   Coral Spikes, [place] 07/22/19 1724

## 2019-07-22 NOTE — ED Triage Notes (Addendum)
Patient c/o diaphoresis that started this morning. She reports feeling shaky this morning and dizzy. She states she has a history of vertigo. She also reports not urinating as often as she normally does.

## 2019-10-28 ENCOUNTER — Other Ambulatory Visit: Payer: Self-pay | Admitting: Medical Oncology

## 2019-10-28 DIAGNOSIS — Z1231 Encounter for screening mammogram for malignant neoplasm of breast: Secondary | ICD-10-CM

## 2019-11-12 ENCOUNTER — Other Ambulatory Visit: Payer: Self-pay

## 2019-11-12 ENCOUNTER — Ambulatory Visit
Admission: RE | Admit: 2019-11-12 | Discharge: 2019-11-12 | Disposition: A | Discharge status: Home / Self Care | Payer: Medicare Other | Source: Ambulatory Visit | Attending: Medical Oncology | Admitting: Medical Oncology

## 2019-11-12 DIAGNOSIS — Z1231 Encounter for screening mammogram for malignant neoplasm of breast: Secondary | ICD-10-CM | POA: Diagnosis present

## 2020-10-05 ENCOUNTER — Other Ambulatory Visit: Payer: Self-pay

## 2020-10-05 DIAGNOSIS — Z1231 Encounter for screening mammogram for malignant neoplasm of breast: Secondary | ICD-10-CM

## 2020-11-16 ENCOUNTER — Other Ambulatory Visit: Payer: Self-pay

## 2020-11-16 ENCOUNTER — Ambulatory Visit
Admission: RE | Admit: 2020-11-16 | Discharge: 2020-11-16 | Disposition: A | Discharge status: Home / Self Care | Payer: Medicare Other | Source: Ambulatory Visit

## 2020-11-16 DIAGNOSIS — Z1231 Encounter for screening mammogram for malignant neoplasm of breast: Secondary | ICD-10-CM | POA: Diagnosis present

## 2020-11-19 ENCOUNTER — Ambulatory Visit: Payer: Medicare Other

## 2020-11-19 ENCOUNTER — Emergency Department
Admission: EM | Admit: 2020-11-19 | Discharge: 2020-11-19 | Disposition: A | Discharge status: Home / Self Care | Payer: Medicare Other | Attending: Emergency Medicine | Admitting: Emergency Medicine

## 2020-11-19 ENCOUNTER — Encounter: Payer: Self-pay | Admitting: Emergency Medicine

## 2020-11-19 ENCOUNTER — Other Ambulatory Visit: Payer: Self-pay

## 2020-11-19 ENCOUNTER — Emergency Department: Payer: Medicare Other

## 2020-11-19 ENCOUNTER — Ambulatory Visit
Admission: EM | Admit: 2020-11-19 | Discharge: 2020-11-19 | Disposition: A | Discharge status: Home / Self Care | Payer: Medicare Other | Attending: Family Medicine | Admitting: Family Medicine

## 2020-11-19 DIAGNOSIS — I2510 Atherosclerotic heart disease of native coronary artery without angina pectoris: Secondary | ICD-10-CM | POA: Diagnosis not present

## 2020-11-19 DIAGNOSIS — E119 Type 2 diabetes mellitus without complications: Secondary | ICD-10-CM | POA: Diagnosis not present

## 2020-11-19 DIAGNOSIS — Z87891 Personal history of nicotine dependence: Secondary | ICD-10-CM | POA: Insufficient documentation

## 2020-11-19 DIAGNOSIS — Z20822 Contact with and (suspected) exposure to covid-19: Secondary | ICD-10-CM | POA: Diagnosis not present

## 2020-11-19 DIAGNOSIS — Z7984 Long term (current) use of oral hypoglycemic drugs: Secondary | ICD-10-CM | POA: Diagnosis not present

## 2020-11-19 DIAGNOSIS — I10 Essential (primary) hypertension: Secondary | ICD-10-CM | POA: Insufficient documentation

## 2020-11-19 DIAGNOSIS — Z96612 Presence of left artificial shoulder joint: Secondary | ICD-10-CM | POA: Insufficient documentation

## 2020-11-19 DIAGNOSIS — R0602 Shortness of breath: Secondary | ICD-10-CM | POA: Diagnosis present

## 2020-11-19 DIAGNOSIS — R600 Localized edema: Secondary | ICD-10-CM | POA: Insufficient documentation

## 2020-11-19 DIAGNOSIS — R110 Nausea: Secondary | ICD-10-CM | POA: Insufficient documentation

## 2020-11-19 DIAGNOSIS — I4891 Unspecified atrial fibrillation: Secondary | ICD-10-CM

## 2020-11-19 DIAGNOSIS — Z79899 Other long term (current) drug therapy: Secondary | ICD-10-CM | POA: Diagnosis not present

## 2020-11-19 LAB — CBC WITH DIFFERENTIAL/PLATELET
Abs Immature Granulocytes: 0.05 10*3/uL (ref 0.00–0.07)
Basophils Absolute: 0.1 10*3/uL (ref 0.0–0.1)
Basophils Relative: 1 %
Eosinophils Absolute: 0.1 10*3/uL (ref 0.0–0.5)
Eosinophils Relative: 1 %
HCT: 43.9 % (ref 36.0–46.0)
Hemoglobin: 14.6 g/dL (ref 12.0–15.0)
Immature Granulocytes: 1 %
Lymphocytes Relative: 22 %
Lymphs Abs: 1.9 10*3/uL (ref 0.7–4.0)
MCH: 29.7 pg (ref 26.0–34.0)
MCHC: 33.3 g/dL (ref 30.0–36.0)
MCV: 89.4 fL (ref 80.0–100.0)
Monocytes Absolute: 0.5 10*3/uL (ref 0.1–1.0)
Monocytes Relative: 6 %
Neutro Abs: 6 10*3/uL (ref 1.7–7.7)
Neutrophils Relative %: 69 %
Platelets: 242 10*3/uL (ref 150–400)
RBC: 4.91 MIL/uL (ref 3.87–5.11)
RDW: 12.9 % (ref 11.5–15.5)
WBC: 8.6 10*3/uL (ref 4.0–10.5)
nRBC: 0 % (ref 0.0–0.2)

## 2020-11-19 LAB — COMPREHENSIVE METABOLIC PANEL
ALT: 15 U/L (ref 0–44)
AST: 22 U/L (ref 15–41)
Albumin: 4.1 g/dL (ref 3.5–5.0)
Alkaline Phosphatase: 59 U/L (ref 38–126)
Anion gap: 10 (ref 5–15)
BUN: 19 mg/dL (ref 8–23)
CO2: 33 mmol/L — ABNORMAL HIGH (ref 22–32)
Calcium: 10.2 mg/dL (ref 8.9–10.3)
Chloride: 97 mmol/L — ABNORMAL LOW (ref 98–111)
Creatinine, Ser: 1.2 mg/dL — ABNORMAL HIGH (ref 0.44–1.00)
GFR, Estimated: 46 mL/min — ABNORMAL LOW (ref >60)
Glucose, Bld: 132 mg/dL — ABNORMAL HIGH (ref 70–99)
Potassium: 3.5 mmol/L (ref 3.5–5.1)
Sodium: 140 mmol/L (ref 135–145)
Total Bilirubin: 0.9 mg/dL (ref 0.3–1.2)
Total Protein: 7.5 g/dL (ref 6.5–8.1)

## 2020-11-19 LAB — BRAIN NATRIURETIC PEPTIDE: B Natriuretic Peptide: 111.3 pg/mL — ABNORMAL HIGH (ref 0.0–100.0)

## 2020-11-19 LAB — MAGNESIUM: Magnesium: 1.9 mg/dL (ref 1.7–2.4)

## 2020-11-19 LAB — RESP PANEL BY RT-PCR (FLU A&B, COVID) ARPGX2
Influenza A by PCR: NEGATIVE
Influenza B by PCR: NEGATIVE
SARS Coronavirus 2 by RT PCR: NEGATIVE

## 2020-11-19 LAB — D-DIMER, QUANTITATIVE: D-Dimer, Quant: 0.31 ug/mL-FEU (ref 0.00–0.50)

## 2020-11-19 LAB — TROPONIN I (HIGH SENSITIVITY): Troponin I (High Sensitivity): 7 ng/L (ref <18)

## 2020-11-19 MED ORDER — APIXABAN 5 MG PO TABS: 5.0000 mg | ORAL_TABLET | Freq: Two times a day (BID) | ORAL | 0 refills | Status: AC

## 2020-11-19 NOTE — ED Notes (Signed)
Patient is being discharged from the Urgent Care and sent to the Emergency Department via EMS . Per Everlene Other DO, patient is in need of higher level of care due to A-fib. Patient is aware and verbalizes understanding of plan of care.  Vitals:   11/19/20 1409  BP: (!) 156/70  Pulse: (!) 106  Resp: 19  Temp: 97.7 F (36.5 C)  SpO2: 94%

## 2020-11-19 NOTE — ED Notes (Signed)
EMS was called for patient at 1420.

## 2020-11-19 NOTE — ED Notes (Signed)
Blue top tube sent to lab. 

## 2020-11-19 NOTE — ED Triage Notes (Addendum)
Pt is present today with lightheadedness, sinus pressure,  and SOB. Pt states that she has a hx wheezing and normally uses albuterol but states that it has not been helping.

## 2020-11-19 NOTE — ED Provider Notes (Signed)
[facility]    CSN: [id number] Arrival date & time: 11/19/20  1357      History   Chief Complaint Chief Complaint  Patient presents with   Shortness of Breath    HPI  80 year old female presents the above complaint.  Patient states that she has not been feeling well since yesterday.  She reports that she had some central chest pain that lasted a few minutes and then it resolved.  She subsequently has developed shortness of breath and dizziness.  She reports some head pressure as well.  No documented fever.  No reports of cough.  She has a neighbor with COVID-19 but she states that she has recently been tested and it has been negative.  Patient appears to be short of breath but speaks in full sentences.  No relieving factors.  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      Home Medications    Prior to Admission medications   Medication Sig Start Date End Date Taking? Authorizing Provider  amLODipine (NORVASC) 10 MG tablet Take 10 mg by mouth every morning.     [provider]  aspirin 81 MG tablet Take 81 mg by mouth every evening.     [provider]  atorvastatin (LIPITOR) 20 MG tablet Take 20 mg by mouth daily at 6 PM.     [provider]   BAYER CONTOUR TEST test strip 1 strip by Other route as needed. 08/23/14   [provider]  calcium carbonate (TUMS - DOSED IN MG ELEMENTAL CALCIUM) 500 MG chewable tablet Chew 3 tablets by mouth 2 (two) times daily with a meal.    [provider]  Calcium Citrate-Vitamin D (CALCIUM + D PO) Take 1 tablet by mouth every evening.    [provider]  cloNIDine (CATAPRES) 0.1 MG tablet Take 1 tablet (0.1 mg total) by mouth 2 (two) times daily. 11/03/18   Payton Mccallum, MD  ferrous sulfate 325 (65 FE) MG tablet Take by mouth.    [provider]  fluticasone (FLONASE) 50 MCG/ACT nasal spray Place 2 sprays into both nostrils daily. 11/03/14   [provider]  furosemide (LASIX) 20 MG tablet Take 20 mg by mouth 3 (three) times a week. As needed for swelling    [provider]  hydrochlorothiazide (HYDRODIURIL) 25 MG tablet Take 25 mg by mouth every morning.     [provider]  ipratropium (ATROVENT) 0.06 % nasal spray Place 2 sprays into both nostrils 4 (four) times daily as needed for rhinitis. 03/02/19   Tommie Sams, DO  losartan (COZAAR) 50 MG tablet Take 50 mg by mouth every morning. 1.5 tablets every am    [provider]  meclizine (ANTIVERT)  25 MG tablet Take 1 tablet (25 mg total) by mouth 3 (three) times daily as needed for dizziness. 07/22/19   Tommie Sams, DO  metFORMIN (GLUCOPHAGE) 500 MG tablet Take by mouth 2 (two) times daily with a meal.    [provider]  metoprolol succinate (TOPROL-XL) 100 MG 24 hr tablet Take 100 mg by mouth every morning. Take with or immediately following a meal.    [provider]  MICROLET LANCETS MISC 1 Stick by Other route daily. 08/23/14   [provider]  Omega-3 Fatty Acids ([employer] FISH OIL) 1000 MG CAPS Take 1 capsule by mouth 2 (two) times daily.     [provider]  omeprazole (PRILOSEC) 20 MG capsule Take 20 mg by mouth every morning.     [provider]  traMADol (ULTRAM) 50 MG tablet Take 1 tablet (50 mg total) by mouth every 6 (six) hours as needed. 03/08/18   Domenick Gong, MD    Family History Family History  Problem Relation Age of Onset   Breast cancer Maternal Aunt     Social History Social History   Tobacco Use   Smoking status: Former    Packs/day: 0.25    Types: Cigarettes   Smokeless tobacco: Never  Vaping Use   Vaping Use: Never used  Substance Use Topics   Alcohol use: Yes    Comment: occasionally   Drug use: No     Allergies   Hydrocodone-acetaminophen   Review of Systems Review of Systems  Respiratory:  Positive for shortness of breath.   Cardiovascular:  Positive for chest pain.  Neurological:  Positive for dizziness.   Physical Exam Triage Vital Signs ED Triage Vitals  Enc Vitals Group     BP 11/19/20 1409 (!) 156/70     Pulse Rate 11/19/20 1409 (!) 106     Resp 11/19/20 1409 19     Temp 11/19/20 1409 97.7 F (36.5 C)     Temp Source 11/19/20 1409 Oral     SpO2 11/19/20 1409 94 %     Weight --      Height --      Head Circumference --      Peak Flow --      Pain Score 11/19/20 1410 0     Pain Loc --      Pain Edu? --      Excl. in GC? --    Updated Vital Signs BP (!) 156/70   Pulse (!) 106   Temp 97.7 F (36.5 C) (Oral)   Resp 19   SpO2 94%   Visual Acuity Right Eye Distance:   Left Eye Distance:   Bilateral Distance:    Right Eye Near:   Left Eye Near:    Bilateral Near:     Physical Exam Vitals and nursing note reviewed.  Constitutional:      Appearance: She is not ill-appearing.  HENT:     Head: Normocephalic and atraumatic.  Eyes:     General:        Right eye: No discharge.        Left eye: No discharge.     Conjunctiva/sclera: Conjunctivae normal.  Cardiovascular:     Comments: Irregularly irregular.  Tachycardic. Pulmonary:     Breath sounds: No wheezing or rales.     Comments: Appears slightly dyspneic with speech. Neurological:      Mental Status: She is alert.  Psychiatric:        Mood  and Affect: Mood normal.        Behavior: Behavior normal.     UC Treatments / Results  Labs (all labs ordered are listed, but only abnormal results are displayed) Labs Reviewed - No data to display  EKG Interpretation: Atrial fibrillation versus flutter.  Rate 104.  No appreciable ST or T wave changes.  Radiology No results found.  Procedures Procedures (including critical care time)  Medications Ordered in UC Medications - No data to display  Initial Impression / Assessment and Plan / UC Course  I have reviewed the triage vital signs and the nursing notes.  Pertinent labs & imaging results that were available during my care of the patient were reviewed by me and considered in my medical decision making (see chart for details).    80 year old female presents with new onset atrial fibrillation versus flutter.  Patient currently symptomatic with dizziness, recent chest pain, and shortness of breath.  Patient is going to the hospital for further management.  Patient is going via EMS.  Final Clinical Impressions(s) / UC Diagnoses   Final diagnoses:  New onset atrial fibrillation (HCC)  Atrial fibrillation with [facility])   Discharge Instructions   None    ED Prescriptions   None    PDMP not reviewed this encounter.   Tommie Sams, [place] 11/19/20 1448

## 2020-11-19 NOTE — ED Notes (Signed)
Patient is being discharged from the Urgent Care and sent to the [facility] Emergency Department via EMS . Per Dr. Adriana Simas, patient is in need of higher level of care due to new onset Atrial Fibrillation. Patient is aware and verbalizes understanding of plan of care.  Vitals:   11/19/20 1409  BP: (!) 156/70  Pulse: (!) 106  Resp: 19  Temp: 97.7 F (36.5 C)  SpO2: 94%

## 2020-11-19 NOTE — ED Provider Notes (Signed)
[facility] Emergency Department Provider Note  ____________________________________________   Event Date/Time   First MD Initiated Contact with Patient 11/19/20 1714     (approximate)  I have reviewed the triage vital signs and the nursing notes.   HISTORY  Chief Complaint Shortness of Breath (Sent by Urgent Care for AFIB)   HPI Alicia [name] is a 80 y.o. female with a past medical history of HTN, HDL, DM, obesity, tobacco abuse and chronic lower extremity swelling on torsemide without known CAD presents to the ED for assessment of some shortness of breath and dizziness with 1 episode of chest pain yesterday and A. fib seen on ECG urgent care today.  Patient states she had some general chest pressure yesterday that resolved.  It was not clearly exertional or positional.  She did start feeling nauseous and had some shortness of breath beginning yesterday as well.  She states she will often have shortness of breath with exertion but feels she can only take a couple steps before feeling significantly short of breath.  She states she is not any chest pain today but still feels very short of breath with minimal exertion.  She also Dors is some mild nausea.  She denies any new headache, earache, sore throat, vomiting, diarrhea, dysuria, rash or other clear acute sick symptoms.  No recent falls or injuries.  No recent changes to her medicines.  States she is been compliant with her medications.         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 Authorizing Provider  apixaban (ELIQUIS) 5 MG TABS tablet Take 1 tablet (5 mg total) by mouth 2 (two) times daily. 11/19/20 12/19/20 Yes Gilles Chiquito, MD  amLODipine (NORVASC) 10 MG tablet Take 10 mg by mouth every morning.     [provider]  atorvastatin (LIPITOR) 20 MG tablet Take 20 mg by mouth daily at 6 PM.     [provider]  BAYER CONTOUR TEST test strip 1 strip by Other route as needed. 08/23/14   [provider]  calcium carbonate (TUMS - DOSED IN MG ELEMENTAL CALCIUM) 500 MG chewable tablet Chew 3 tablets by mouth 2 (two) times daily with a meal.    [provider]  Calcium Citrate-Vitamin D (CALCIUM + D PO) Take 1 tablet by mouth every evening.    [provider]  cloNIDine (CATAPRES) 0.1 MG tablet Take 1 tablet (0.1 mg total) by mouth 2 (two) times daily. 11/03/18   Payton Mccallum, MD  ferrous sulfate 325 (65 FE) MG tablet Take by mouth.    [provider]  fluticasone (FLONASE) 50 MCG/ACT nasal spray Place 2 sprays into both nostrils daily. 11/03/14   [provider]  furosemide (LASIX) 20 MG tablet Take 20 mg by mouth 3 (three) times a week. As needed for swelling    [provider]  hydrochlorothiazide (HYDRODIURIL) 25 MG tablet Take 25 mg by mouth every morning.     [provider]  ipratropium (ATROVENT) 0.06 % nasal spray Place 2 sprays into both nostrils 4 (four) times daily as needed for rhinitis. 03/02/19   Tommie Samsook, Jayce G, DO  losartan (COZAAR) 50 MG tablet Take 50 mg by mouth every morning. 1.5 tablets every am    [provider]  meclizine (ANTIVERT) 25 MG tablet Take 1 tablet (25 mg  total) by mouth 3 (three) times daily as needed for dizziness. 07/22/19   Tommie Samsook, Jayce G, DO  metFORMIN (GLUCOPHAGE) 500 MG tablet Take by mouth 2 (two) times daily with a meal.    [provider]  metoprolol succinate (TOPROL-XL) 100 MG 24 hr tablet Take 100 mg by mouth every morning. Take with or immediately following a meal.    [provider]  MICROLET LANCETS MISC 1 Stick by Other route daily. 08/23/14   [provider]  Omega-3 Fatty Acids ([employer] FISH OIL) 1000 MG CAPS Take 1 capsule by mouth 2 (two) times daily.     [provider]  omeprazole (PRILOSEC) 20 MG capsule Take 20 mg by mouth every morning.     [provider]  traMADol (ULTRAM) 50 MG tablet Take 1 tablet (50 mg total) by mouth every 6 (six) hours as needed. 03/08/18   Domenick GongMortenson, Ashley, MD    Allergies Hydrocodone-acetaminophen  Family History  Problem Relation Age of Onset   Breast cancer Maternal Aunt     Social History Social History   Tobacco Use   Smoking status: Former    Packs/day: 0.25    Types: Cigarettes   Smokeless tobacco: Never  Vaping Use   Vaping Use: Never used  Substance Use Topics   Alcohol use: Yes    Comment: occasionally   Drug use: No    Review of Systems  Review of Systems  Constitutional:  Negative for chills and fever.  HENT:  Negative for sore throat.   Eyes:  Negative for pain.  Respiratory:  Positive for shortness of breath. Negative for cough and stridor.   Cardiovascular:  Positive for chest pain (yesterday now resolved).  Gastrointestinal:  Negative for vomiting.  Genitourinary:  Negative for dysuria.  Musculoskeletal:  Negative for myalgias.  Skin:  Negative for rash.  Neurological:  Positive for dizziness. Negative for seizures, loss of consciousness and headaches.  Psychiatric/Behavioral:  Negative for suicidal ideas.   All other systems reviewed and are negative.    ____________________________________________   PHYSICAL  EXAM:  VITAL SIGNS: ED Triage Vitals  Enc Vitals Group     BP 11/19/20 1543 139/84     Pulse Rate 11/19/20 1543 (!) 105     Resp 11/19/20 1543 16     Temp 11/19/20 1543 98.8 F (37.1 C)     Temp Source 11/19/20 1543 Oral     SpO2 11/19/20 1543 95 %     Weight 11/19/20 1546 250 lb (113.4 kg)     Height 11/19/20 1546 5' (1.524 m)     Head Circumference --      Peak Flow --      Pain Score 11/19/20 1544 0     Pain Loc --      Pain Edu? --      Excl. in  GC? --    Vitals:   11/19/20 1543 11/19/20 1740  BP: 139/84 (!) 162/101  Pulse: (!) 105 (!) 101  Resp: 16 16  Temp: 98.8 F (37.1 C)   SpO2: 95% 96%   Physical Exam Vitals and nursing note reviewed.  Constitutional:      General: She is not in acute distress.    Appearance: She is well-developed. She is obese.  HENT:     Head: Normocephalic and atraumatic.     Right Ear: External ear normal.     Left Ear: External ear normal.     Nose: Nose normal.  Eyes:     Conjunctiva/sclera: Conjunctivae normal.  Cardiovascular:     Rate and Rhythm: Tachycardia present. Rhythm irregular.     Heart sounds: No murmur heard. Pulmonary:     Effort: Pulmonary effort is normal. No respiratory distress.     Breath sounds: Normal breath sounds.  Abdominal:     Palpations: Abdomen is soft.     Tenderness: There is no abdominal tenderness.  Musculoskeletal:     Cervical back: Neck supple.     Right lower leg: Edema present.     Left lower leg: Edema present.  Skin:    General: Skin is warm and dry.  Neurological:     Mental Status: She is alert and oriented to person, place, and time.  Psychiatric:        Mood and Affect: Mood normal.     ____________________________________________   LABS (all labs ordered are listed, but only abnormal results are displayed)  Labs Reviewed  COMPREHENSIVE METABOLIC PANEL - Abnormal; Notable for the following components:      Result Value   Chloride 97 (*)    CO2 33 (*)    Glucose, Bld 132  (*)    Creatinine, Ser 1.20 (*)    GFR, Estimated 46 (*)    All other components within normal limits  BRAIN NATRIURETIC PEPTIDE - Abnormal; Notable for the following components:   B Natriuretic Peptide 111.3 (*)    All other components within normal limits  RESP PANEL BY RT-PCR (FLU A&B, COVID) ARPGX2  CBC WITH DIFFERENTIAL/PLATELET  MAGNESIUM  D-DIMER, QUANTITATIVE  TROPONIN I (HIGH SENSITIVITY)   ____________________________________________  EKG  A. fib with a rate of 92, normal axis, unremarkable intervals with an ossific change versus artifact in inferior leads. ____________________________________________  RADIOLOGY  ED MD interpretation: Chest x-ray shows some cardiomegaly without large effusion, overt edema, pneumothorax, focal consolidation or other clear acute intrathoracic process.  Official radiology report(s): DG Chest 2 View  Result Date: 11/19/2020 CLINICAL DATA:  Dizziness shortness of breath EXAM: CHEST - 2 VIEW COMPARISON:  03/08/2018 FINDINGS: Left shoulder replacement. Stable density/sclerosis at the right scapula. Cardiomegaly with aortic atherosclerosis. No focal opacity, pleural effusion or pneumothorax. IMPRESSION: No active cardiopulmonary disease. Cardiomegaly Electronically Signed   By: Jasmine Pang M.D.   On: 11/19/2020 16:49    ____________________________________________   PROCEDURES  Procedure(s) performed (including Critical Care):  .1-3 Lead EKG Interpretation  Date/Time: 11/19/2020 7:12 PM Performed by: Gilles Chiquito, MD Authorized by: Gilles Chiquito, MD     Interpretation: non-specific     ECG rate assessment: tachycardic     Rhythm: atrial fibrillation     Ectopy: none     ____________________________________________   INITIAL IMPRESSION / ASSESSMENT AND PLAN / ED COURSE      Patient presents with above-stated history exam for assessment of an episode of chest pain that  lasted few minutes yesterday and resolved on its own  with associate with some shortness of breath and nausea that persisted today.  Patient was seen in urgent care where she was noted to have A. fib on ECG and transferred to the ED.  In the ED she is tachycardic at 105 with otherwise stable vital signs on room air.  She is in regular slight tachycardic rhythm and appears obese with some mild lower extremity edema but otherwise no significant Rester distress with unremarkable exam.  Other part of chest pain shortness of breath as well as new A. fib differential includes ACS, metabolic derangements, anemia, PE, acute infectious process like pneumonia or bronchitis and acute heart failure exacerbation.  Chest x-ray shows stable cardiomegaly without clear focal consolidation, effusion, edema pneumothorax or other clear acute process.  E CG is A. fib rate of 92 and some nonspecific ST changes.  CBC shows no leukocytosis or acute anemia.  CMP shows significant electrode metabolic derangements aside from creatinine of 1.2 compared to 0.81 1-year ago.  0.31 and not consistent with PE.  Troponin is 7 and given patient states chest pain was more than 3 hours prior to arrival [facility] patient for ACS or myocarditis.  Magnesium is unremarkable.  COVID influenza negative.  BNP is 111 and patient has no significant edema on x-ray.  She was able to ambulate unassisted with SPO2 greater 95% to the bathroom and back.  Unclear etiology of shortness of breath is possible she experienced short paroxysmal tachycardia.  Do not believe she requires rate control or hospitalization at this time.  We will start her on Eliquis given CHA2DS2-VASc score of 5.  Advised to call her cardiologist tomorrow to schedule follow-up appointment.  Discharged stable condition.  Strict return precautions advised discussed         ____________________________________________   FINAL CLINICAL IMPRESSION(S) / ED DIAGNOSES  Final diagnoses:  Atrial fibrillation, unspecified type (HCC)     Medications - No data to display   ED Discharge Orders          Ordered    apixaban (ELIQUIS) 5 MG TABS tablet  2 times daily        11/19/20 1835             Note:  This document was prepared using Dragon voice recognition software and may include unintentional dictation errors.    Gilles Chiquito, MD 11/19/20 [phone]

## 2020-11-19 NOTE — ED Triage Notes (Signed)
Pt comes into the ED via [facility] from UC c/o dizziness and SHOB with exertion.  Pt states this has been ongoing since yesterday.  UC found new onset A-fib with a rate at 98-104.    22 L AC 124/84 95% RA Pain 0

## 2021-10-17 ENCOUNTER — Other Ambulatory Visit: Payer: Self-pay | Admitting: Chiropractic Medicine

## 2021-10-17 ENCOUNTER — Other Ambulatory Visit: Payer: Self-pay | Admitting: Family Medicine

## 2021-10-17 DIAGNOSIS — Z1231 Encounter for screening mammogram for malignant neoplasm of breast: Secondary | ICD-10-CM

## 2021-11-17 ENCOUNTER — Ambulatory Visit: Payer: Medicare Other

## 2021-12-01 ENCOUNTER — Ambulatory Visit
Admission: RE | Admit: 2021-12-01 | Discharge: 2021-12-01 | Disposition: A | Discharge status: Home / Self Care | Payer: Medicare Other | Source: Ambulatory Visit | Attending: Family Medicine | Admitting: Family Medicine

## 2021-12-01 DIAGNOSIS — Z1231 Encounter for screening mammogram for malignant neoplasm of breast: Secondary | ICD-10-CM | POA: Diagnosis present

## 2022-02-07 IMAGING — MG DIGITAL SCREENING BILAT W/ TOMO W/ CAD
8 of 15 series · 8 of 40 positions shown · non-contrast
Comparison: Previous exam(s).

CLINICAL DATA: Screening.

EXAM:
DIGITAL SCREENING BILATERAL MAMMOGRAM WITH TOMO AND CAD

[L CC synth-2D (1 of 2)]
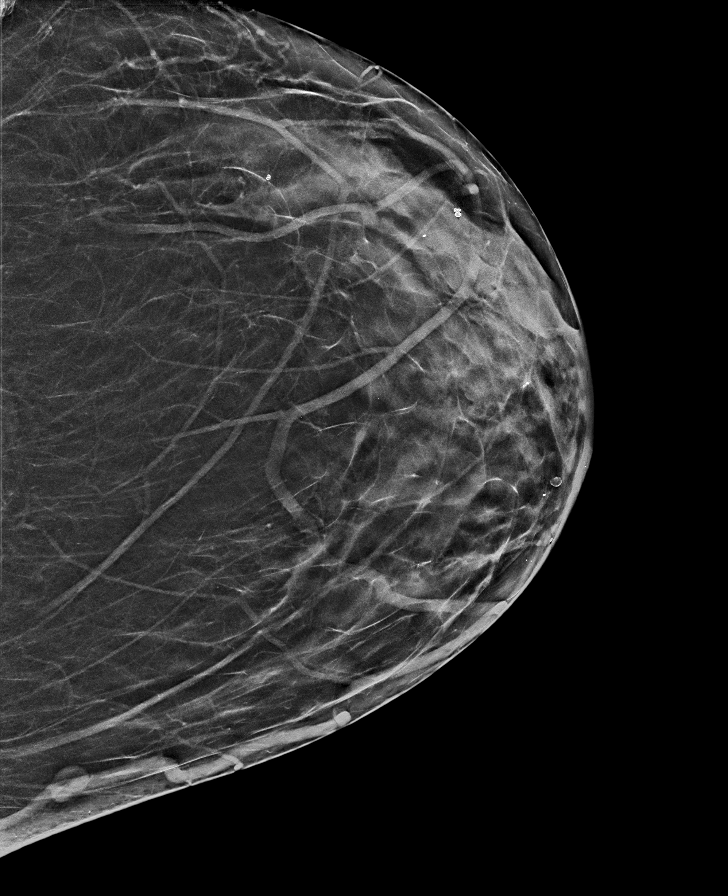

[R CC synth-2D]
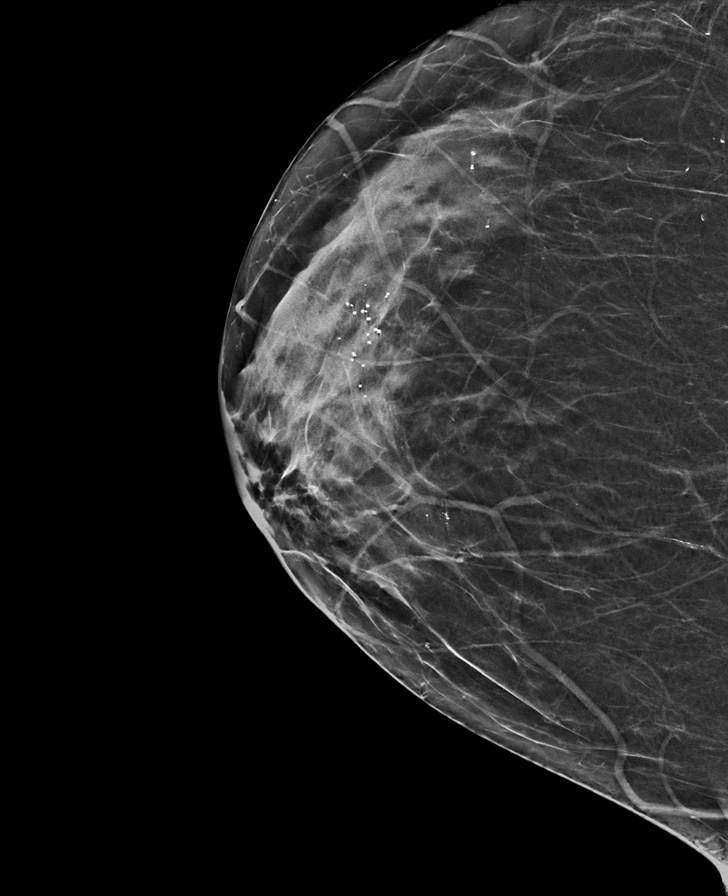

[L MLO synth-2D (1 of 2)]
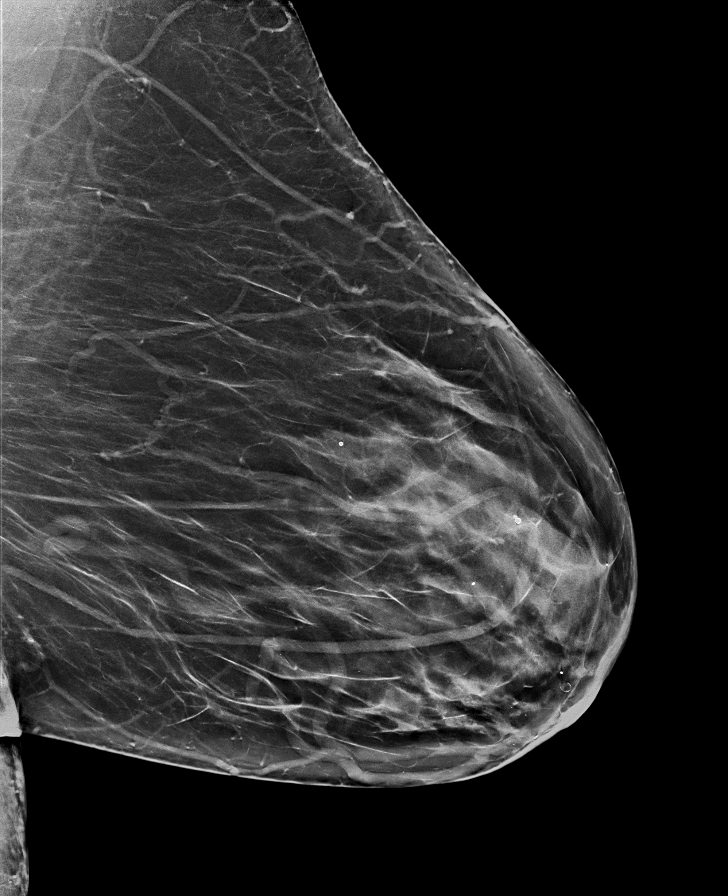

[L CC synth-2D (2 of 2)]
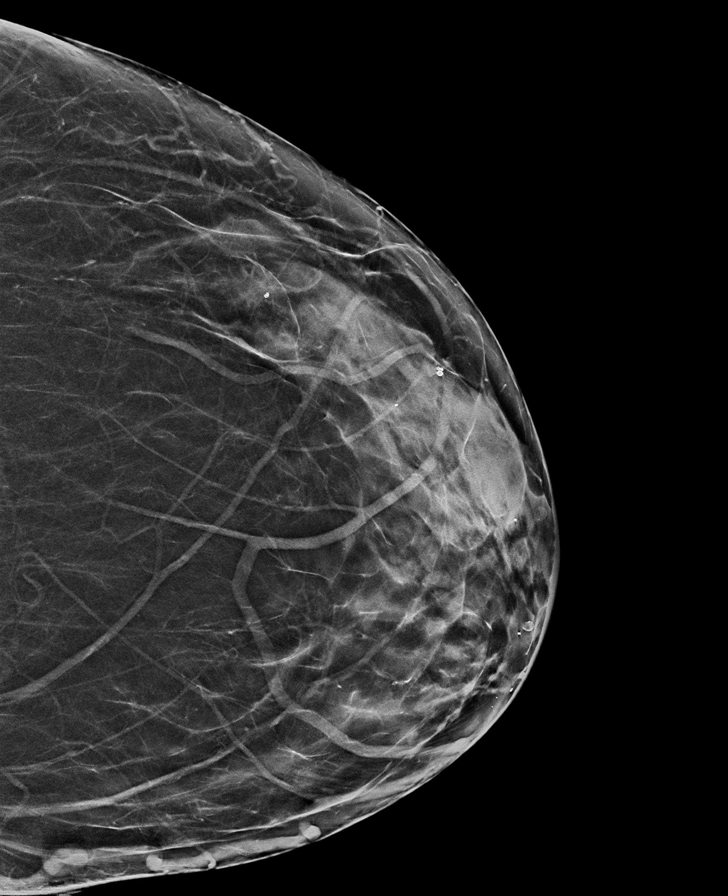

[L MLO synth-2D (2 of 2)]
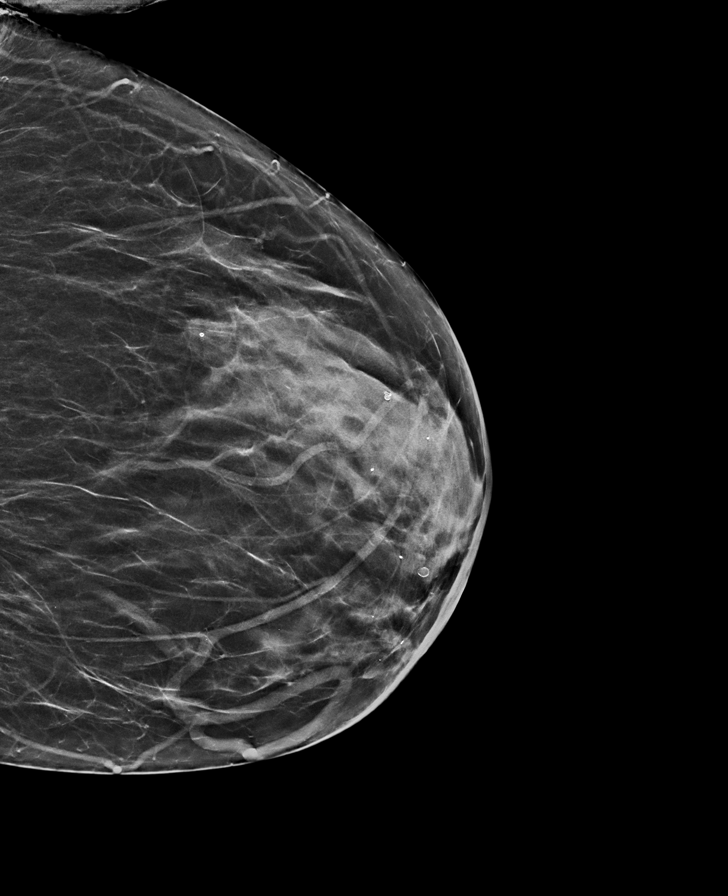

[R MLO synth-2D (1 of 2)]
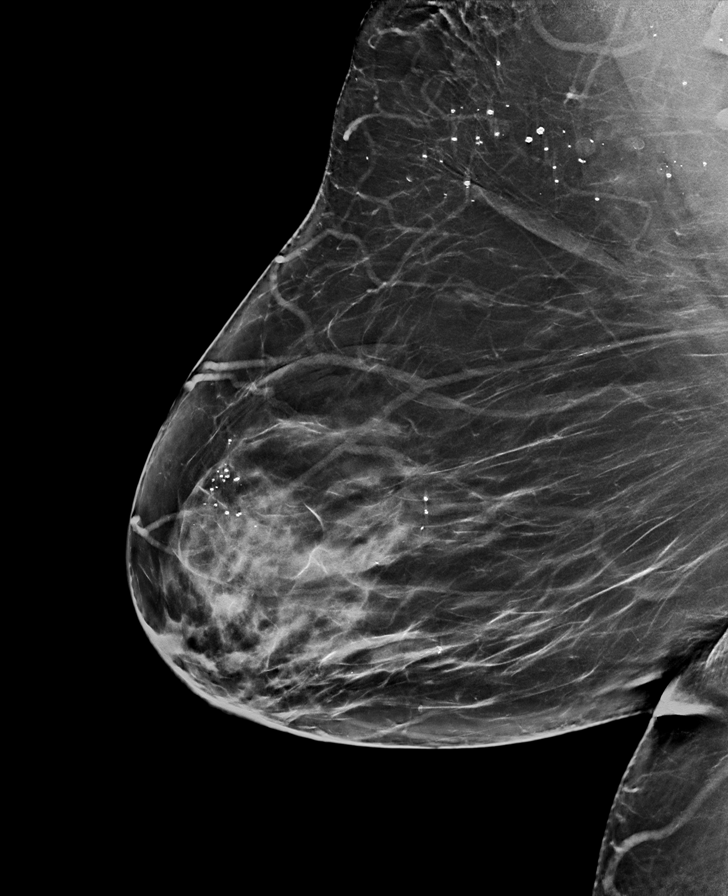

[R MLO synth-2D (2 of 2)]
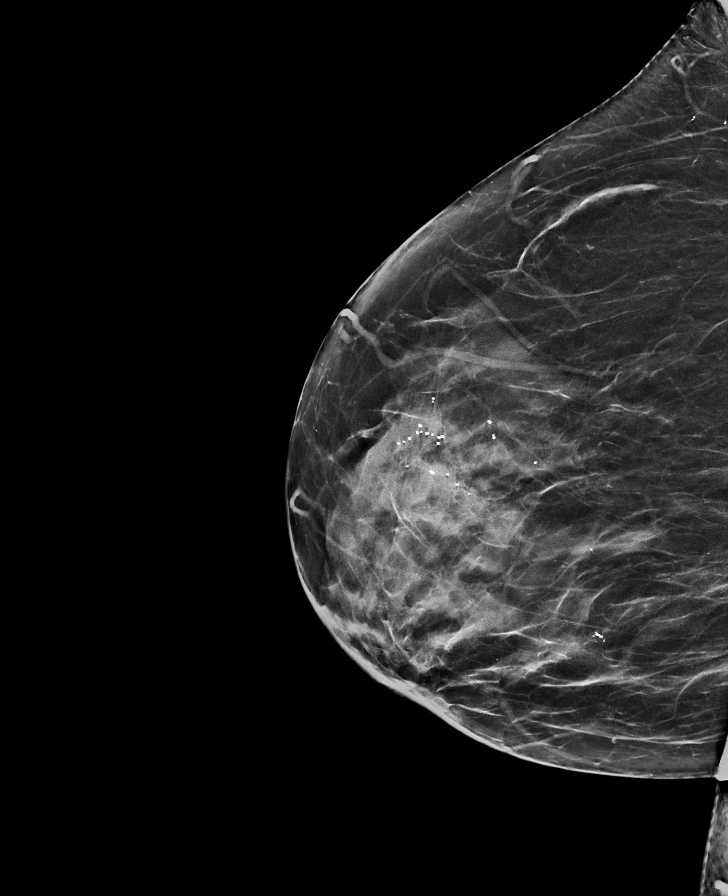

[L CC tomo · tomo slice 41/60.0]
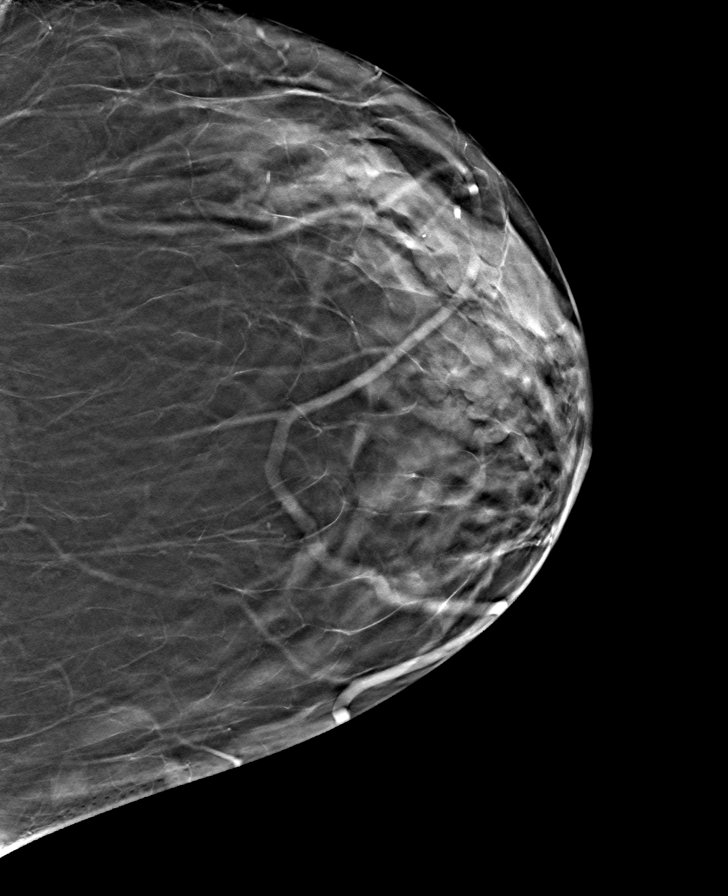

[8 of 40 positions shown; findings below may reference images not displayed]

ACR Breast Density Category b: There are scattered areas of
fibroglandular density.
FINDINGS: There are no findings suspicious for malignancy. Images were
processed with CAD.
IMPRESSION: No mammographic evidence of malignancy. A result letter of this
screening mammogram will be mailed directly to the patient.

RECOMMENDATION:
Screening mammogram in one year. (Code:CN-[id number])

BI-RADS CATEGORY  1: Negative.

## 2022-11-01 ENCOUNTER — Other Ambulatory Visit: Payer: Self-pay | Admitting: Physician Assistant

## 2022-11-01 DIAGNOSIS — Z1231 Encounter for screening mammogram for malignant neoplasm of breast: Secondary | ICD-10-CM

## 2022-12-04 ENCOUNTER — Ambulatory Visit
Admission: RE | Admit: 2022-12-04 | Discharge: 2022-12-04 | Disposition: A | Discharge status: Home / Self Care | Payer: Medicare Other | Source: Ambulatory Visit | Attending: Physician Assistant | Admitting: Physician Assistant

## 2022-12-04 DIAGNOSIS — Z1231 Encounter for screening mammogram for malignant neoplasm of breast: Secondary | ICD-10-CM | POA: Insufficient documentation

## 2023-02-15 IMAGING — CR DG CHEST 2V
1 series · 2 of 2 positions shown · non-contrast
Comparison: 03/08/2018

CLINICAL DATA: Dizziness shortness of breath

EXAM:
CHEST - 2 VIEW

[Series 1: dg chest port 1 view · 0.14mm/px · 2 of 2 slices shown]
[im 1/2]
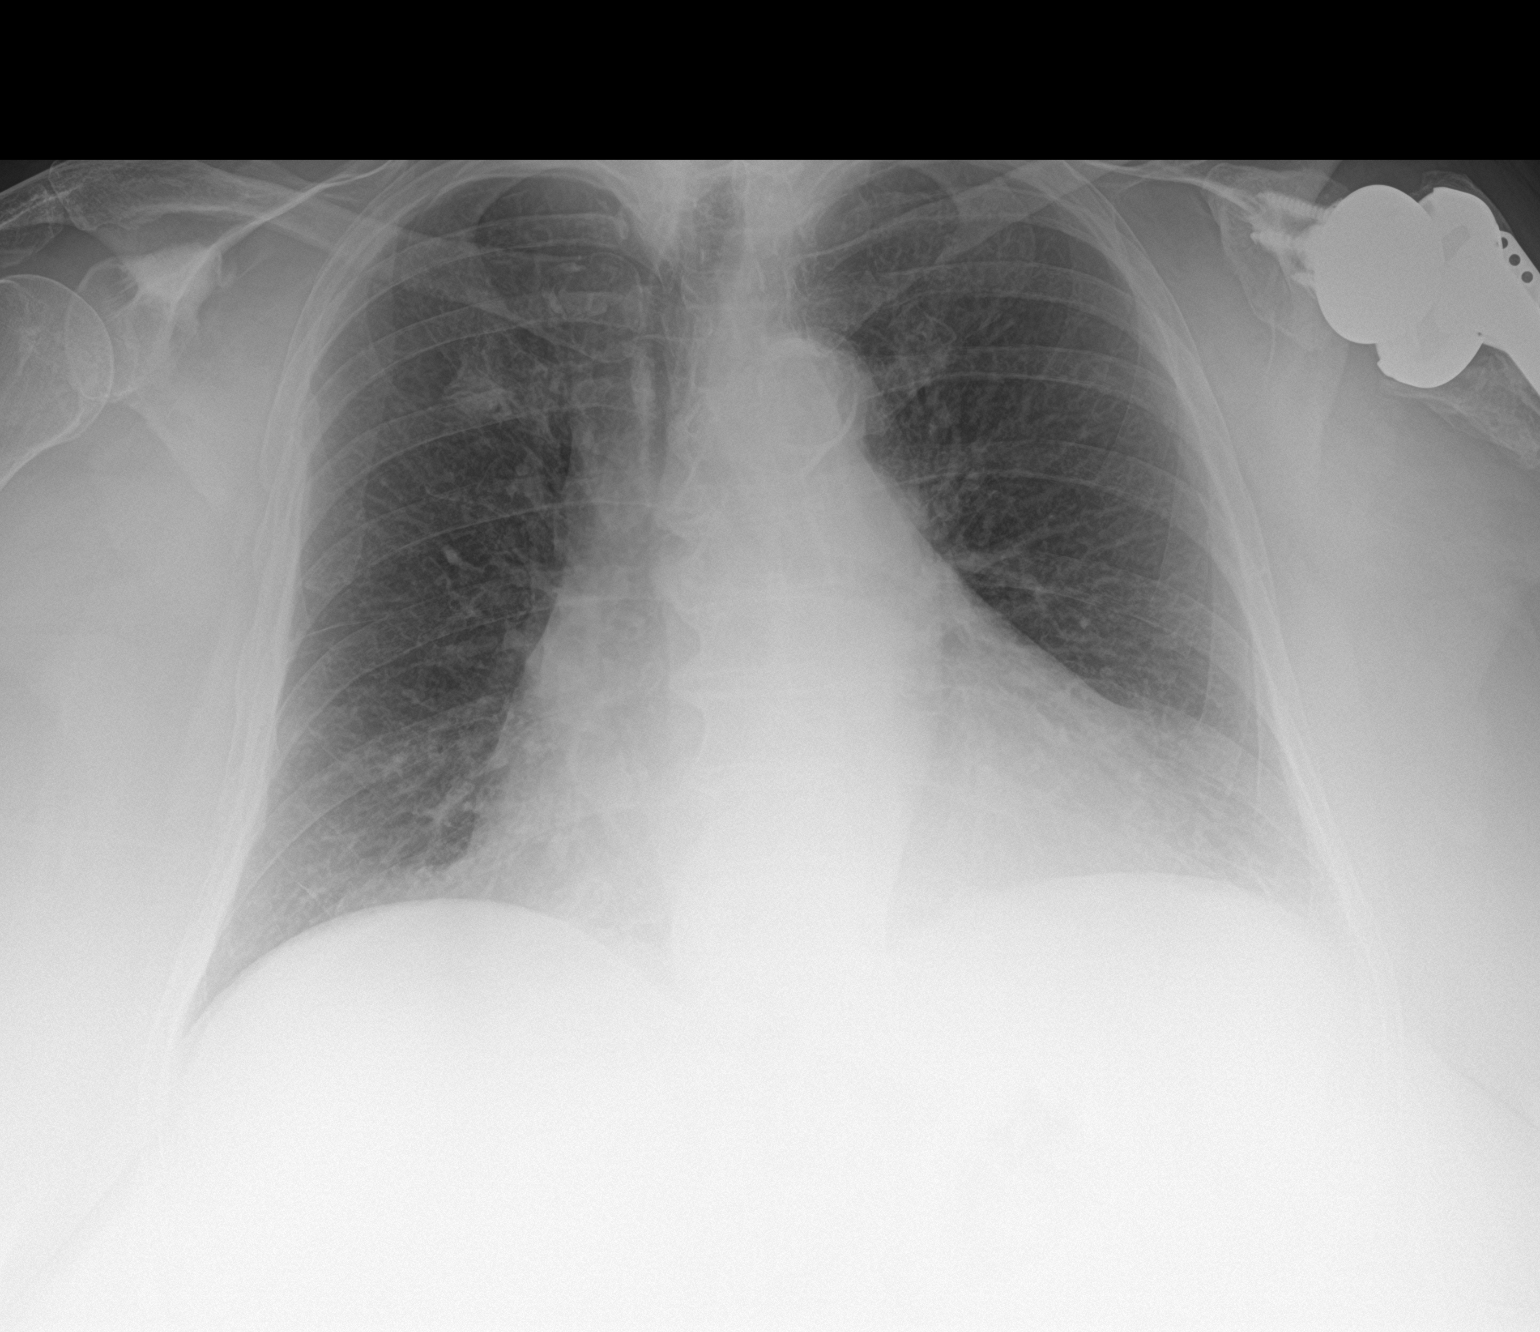
[im 2/2]
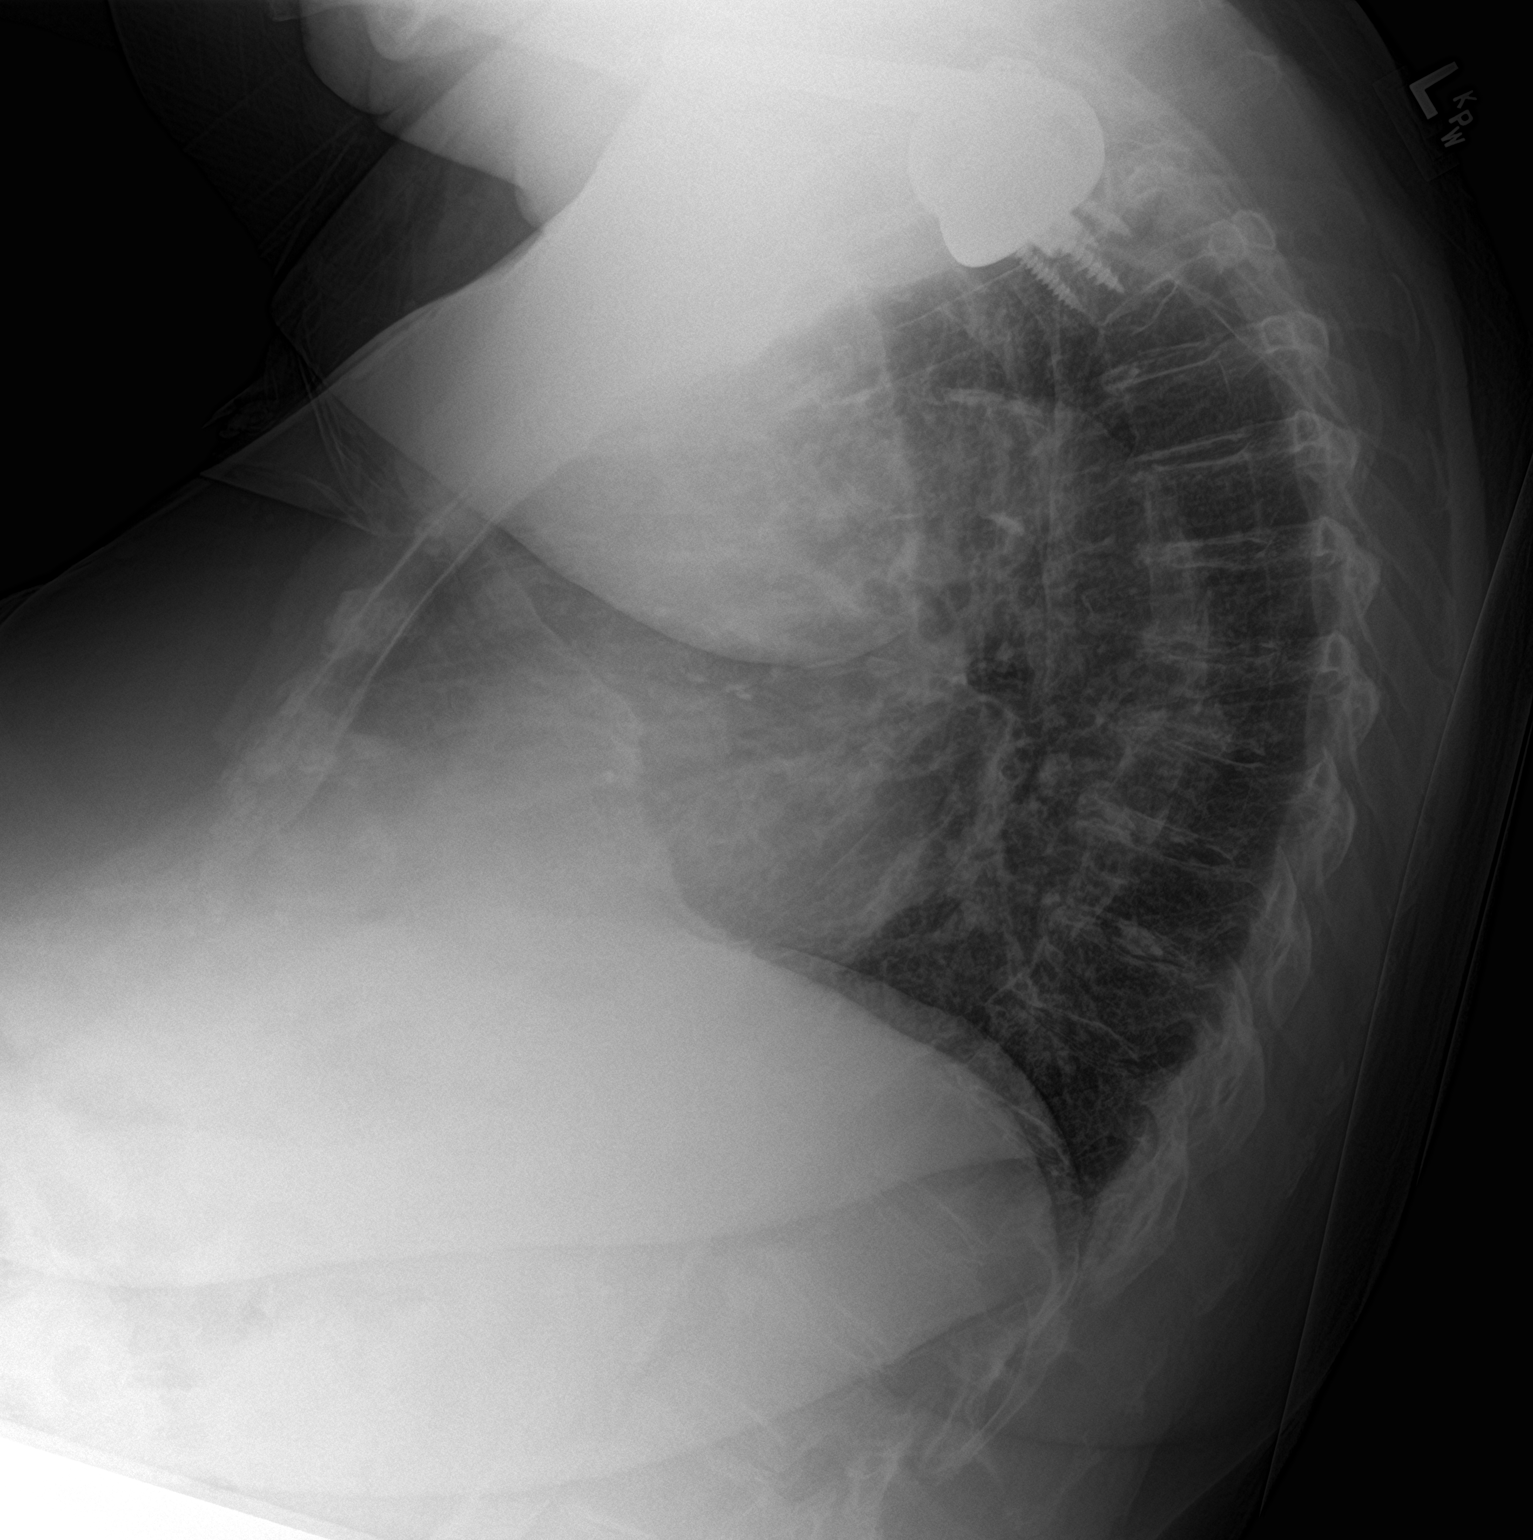

[2 of 2 positions shown; findings below may reference images not displayed]

FINDINGS: Left shoulder replacement. Stable density/sclerosis at the right
scapula. Cardiomegaly with aortic atherosclerosis. No focal opacity,
pleural effusion or pneumothorax.
IMPRESSION: No active cardiopulmonary disease.

Cardiomegaly

## 2023-11-05 ENCOUNTER — Other Ambulatory Visit: Payer: Self-pay | Admitting: Physician Assistant

## 2023-11-05 DIAGNOSIS — Z1231 Encounter for screening mammogram for malignant neoplasm of breast: Secondary | ICD-10-CM

## 2023-12-06 ENCOUNTER — Ambulatory Visit
Admission: RE | Admit: 2023-12-06 | Discharge: 2023-12-06 | Disposition: A | Discharge status: Home / Self Care | Source: Ambulatory Visit | Attending: Physician Assistant | Admitting: Physician Assistant

## 2023-12-06 DIAGNOSIS — Z1231 Encounter for screening mammogram for malignant neoplasm of breast: Secondary | ICD-10-CM | POA: Insufficient documentation
